# Patient Record
Sex: Male | Born: 1990 | Race: White | Hispanic: No | Marital: Single | Smoking: Former smoker
Health system: Southern US, Community
[De-identification: ages and names within clinical notes are randomized; demographics above are authoritative.]

## PROBLEM LIST (undated history)

## (undated) DIAGNOSIS — K5900 Constipation, unspecified: Secondary | ICD-10-CM

## (undated) DIAGNOSIS — F419 Anxiety disorder, unspecified: Secondary | ICD-10-CM

## (undated) DIAGNOSIS — F32A Depression, unspecified: Secondary | ICD-10-CM

## (undated) DIAGNOSIS — F329 Major depressive disorder, single episode, unspecified: Secondary | ICD-10-CM

## (undated) DIAGNOSIS — F99 Mental disorder, not otherwise specified: Secondary | ICD-10-CM

---
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
## 2008-04-04 ENCOUNTER — Emergency Department (HOSPITAL_COMMUNITY): Admission: EM | Admit: 2008-04-04 | Discharge: 2008-04-04 | Payer: Self-pay | Admitting: Emergency Medicine

## 2008-04-05 ENCOUNTER — Ambulatory Visit: Payer: Self-pay | Admitting: Psychiatry

## 2008-04-05 ENCOUNTER — Inpatient Hospital Stay (HOSPITAL_COMMUNITY): Admission: AD | Admit: 2008-04-05 | Discharge: 2008-04-07 | Payer: Self-pay | Admitting: Psychiatry

## 2008-04-24 ENCOUNTER — Emergency Department: Payer: Self-pay | Admitting: Emergency Medicine

## 2008-05-08 ENCOUNTER — Inpatient Hospital Stay (HOSPITAL_COMMUNITY): Admission: AD | Admit: 2008-05-08 | Discharge: 2008-05-10 | Payer: Self-pay | Admitting: *Deleted

## 2008-05-08 ENCOUNTER — Emergency Department (HOSPITAL_COMMUNITY): Admission: EM | Admit: 2008-05-08 | Discharge: 2008-05-08 | Payer: Self-pay | Admitting: Emergency Medicine

## 2008-05-08 ENCOUNTER — Ambulatory Visit: Payer: Self-pay | Admitting: *Deleted

## 2008-05-26 ENCOUNTER — Emergency Department (HOSPITAL_COMMUNITY): Admission: EM | Admit: 2008-05-26 | Discharge: 2008-05-27 | Payer: Self-pay | Admitting: Emergency Medicine

## 2008-05-27 ENCOUNTER — Inpatient Hospital Stay (HOSPITAL_COMMUNITY): Admission: EM | Admit: 2008-05-27 | Discharge: 2008-05-27 | Payer: Self-pay | Admitting: Psychiatry

## 2008-05-29 ENCOUNTER — Emergency Department (HOSPITAL_COMMUNITY): Admission: EM | Admit: 2008-05-29 | Discharge: 2008-05-31 | Payer: Self-pay | Admitting: Emergency Medicine

## 2008-05-31 ENCOUNTER — Inpatient Hospital Stay (HOSPITAL_COMMUNITY): Admission: AD | Admit: 2008-05-31 | Discharge: 2008-06-01 | Payer: Self-pay | Admitting: Psychiatry

## 2008-06-08 ENCOUNTER — Emergency Department (HOSPITAL_COMMUNITY): Admission: EM | Admit: 2008-06-08 | Discharge: 2008-06-09 | Payer: Self-pay | Admitting: Emergency Medicine

## 2008-06-12 ENCOUNTER — Emergency Department (HOSPITAL_COMMUNITY): Admission: EM | Admit: 2008-06-12 | Discharge: 2008-06-12 | Payer: Self-pay | Admitting: Emergency Medicine

## 2008-06-13 ENCOUNTER — Emergency Department (HOSPITAL_COMMUNITY): Admission: EM | Admit: 2008-06-13 | Discharge: 2008-06-13 | Payer: Self-pay | Admitting: Emergency Medicine

## 2008-06-13 ENCOUNTER — Inpatient Hospital Stay (HOSPITAL_COMMUNITY): Admission: EM | Admit: 2008-06-13 | Discharge: 2008-06-16 | Payer: Self-pay | Admitting: Psychiatry

## 2008-06-13 ENCOUNTER — Ambulatory Visit: Payer: Self-pay | Admitting: Psychiatry

## 2008-06-18 ENCOUNTER — Emergency Department (HOSPITAL_COMMUNITY): Admission: EM | Admit: 2008-06-18 | Discharge: 2008-06-19 | Payer: Self-pay | Admitting: Emergency Medicine

## 2008-06-19 ENCOUNTER — Inpatient Hospital Stay (HOSPITAL_COMMUNITY): Admission: EM | Admit: 2008-06-19 | Discharge: 2008-06-22 | Payer: Self-pay | Admitting: Psychiatry

## 2008-06-24 ENCOUNTER — Emergency Department (HOSPITAL_COMMUNITY): Admission: EM | Admit: 2008-06-24 | Discharge: 2008-06-25 | Payer: Self-pay | Admitting: Emergency Medicine

## 2008-07-28 ENCOUNTER — Emergency Department (HOSPITAL_COMMUNITY): Admission: EM | Admit: 2008-07-28 | Discharge: 2008-07-28 | Payer: Self-pay | Admitting: Diagnostic Radiology

## 2008-08-22 ENCOUNTER — Inpatient Hospital Stay (HOSPITAL_COMMUNITY): Admission: EM | Admit: 2008-08-22 | Discharge: 2008-08-23 | Payer: Self-pay | Admitting: *Deleted

## 2008-08-22 ENCOUNTER — Ambulatory Visit: Payer: Self-pay | Admitting: *Deleted

## 2008-08-22 ENCOUNTER — Emergency Department (HOSPITAL_COMMUNITY): Admission: EM | Admit: 2008-08-22 | Discharge: 2008-08-23 | Payer: Self-pay | Admitting: Emergency Medicine

## 2008-10-30 ENCOUNTER — Inpatient Hospital Stay: Payer: Self-pay | Admitting: Internal Medicine

## 2008-10-31 ENCOUNTER — Inpatient Hospital Stay: Payer: Self-pay | Admitting: Psychiatry

## 2009-01-20 ENCOUNTER — Inpatient Hospital Stay: Payer: Self-pay | Admitting: Psychiatry

## 2009-02-15 ENCOUNTER — Inpatient Hospital Stay: Payer: Self-pay | Admitting: Psychiatry

## 2009-02-18 ENCOUNTER — Other Ambulatory Visit: Payer: Self-pay | Admitting: Emergency Medicine

## 2009-02-19 ENCOUNTER — Ambulatory Visit: Payer: Self-pay | Admitting: *Deleted

## 2009-02-19 ENCOUNTER — Inpatient Hospital Stay (HOSPITAL_COMMUNITY): Admission: AD | Admit: 2009-02-19 | Discharge: 2009-02-21 | Payer: Self-pay | Admitting: *Deleted

## 2009-02-25 ENCOUNTER — Emergency Department: Payer: Self-pay | Admitting: Emergency Medicine

## 2009-05-17 ENCOUNTER — Other Ambulatory Visit: Payer: Self-pay

## 2009-05-17 ENCOUNTER — Inpatient Hospital Stay (HOSPITAL_COMMUNITY): Admission: RE | Admit: 2009-05-17 | Discharge: 2009-05-24 | Payer: Self-pay | Admitting: Psychiatry

## 2009-05-17 ENCOUNTER — Ambulatory Visit: Payer: Self-pay | Admitting: Psychiatry

## 2010-09-25 ENCOUNTER — Emergency Department: Payer: Self-pay | Admitting: Emergency Medicine

## 2010-11-28 ENCOUNTER — Inpatient Hospital Stay (HOSPITAL_COMMUNITY)
Admission: RE | Admit: 2010-11-28 | Discharge: 2010-12-01 | Payer: Self-pay | Source: Home / Self Care | Attending: Psychiatry | Admitting: Psychiatry

## 2010-11-28 ENCOUNTER — Emergency Department (HOSPITAL_COMMUNITY)
Admission: EM | Admit: 2010-11-28 | Discharge: 2010-11-28 | Disposition: A | Discharge status: Other Acute Inpatient Hospital | Payer: Self-pay | Source: Home / Self Care | Admitting: Emergency Medicine

## 2010-12-05 LAB — BASIC METABOLIC PANEL
BUN: 14 mg/dL (ref 6–23)
CO2: 25 mEq/L (ref 19–32)
Calcium: 9.2 mg/dL (ref 8.4–10.5)
Chloride: 107 mEq/L (ref 96–112)
Creatinine, Ser: 0.95 mg/dL (ref 0.4–1.5)
GFR calc Af Amer: >60 mL/min (ref >60)
GFR calc non Af Amer: >60 mL/min (ref >60)
Glucose, Bld: 95 mg/dL (ref 70–99)
Potassium: 3.7 mEq/L (ref 3.5–5.1)
Sodium: 138 mEq/L (ref 135–145)

## 2010-12-05 LAB — HEPATIC FUNCTION PANEL
ALT: 29 U/L (ref 0–53)
AST: 23 U/L (ref 0–37)
Albumin: 4.3 g/dL (ref 3.5–5.2)
Alkaline Phosphatase: 75 U/L (ref 39–117)
Bilirubin, Direct: <0.1 mg/dL (ref 0.0–0.3)
Total Bilirubin: 0.6 mg/dL (ref 0.3–1.2)
Total Protein: 7.3 g/dL (ref 6.0–8.3)

## 2010-12-05 LAB — RAPID URINE DRUG SCREEN, HOSP PERFORMED
Amphetamines: NOT DETECTED
Barbiturates: NOT DETECTED
Benzodiazepines: NOT DETECTED
Cocaine: NOT DETECTED
Opiates: NOT DETECTED
Tetrahydrocannabinol: NOT DETECTED

## 2010-12-05 LAB — DIFFERENTIAL
Basophils Absolute: 0.1 10*3/uL (ref 0.0–0.1)
Basophils Relative: 1 % (ref 0–1)
Eosinophils Absolute: 0.3 10*3/uL (ref 0.0–0.7)
Eosinophils Relative: 3 % (ref 0–5)
Lymphocytes Relative: 39 % (ref 12–46)
Lymphs Abs: 4.1 10*3/uL — ABNORMAL HIGH (ref 0.7–4.0)
Monocytes Absolute: 1.2 10*3/uL — ABNORMAL HIGH (ref 0.1–1.0)
Monocytes Relative: 11 % (ref 3–12)
Neutro Abs: 4.8 10*3/uL (ref 1.7–7.7)
Neutrophils Relative %: 46 % (ref 43–77)

## 2010-12-05 LAB — ETHANOL: Alcohol, Ethyl (B): <5 mg/dL (ref 0–10)

## 2010-12-05 LAB — CBC
HCT: 42.8 % (ref 39.0–52.0)
Hemoglobin: 15.3 g/dL (ref 13.0–17.0)
MCH: 31.1 pg (ref 26.0–34.0)
MCHC: 35.7 g/dL (ref 30.0–36.0)
MCV: 87 fL (ref 78.0–100.0)
Platelets: 311 10*3/uL (ref 150–400)
RBC: 4.92 MIL/uL (ref 4.22–5.81)
RDW: 12.1 % (ref 11.5–15.5)
WBC: 10.4 10*3/uL (ref 4.0–10.5)

## 2010-12-05 LAB — TRICYCLICS SCREEN, URINE: TCA Scrn: NOT DETECTED
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
## 2011-02-27 LAB — CBC
HCT: 43.2 % (ref 39.0–52.0)
HCT: 46.5 % (ref 39.0–52.0)
Hemoglobin: 14.8 g/dL (ref 13.0–17.0)
Hemoglobin: 15.9 g/dL (ref 13.0–17.0)
MCHC: 34.1 g/dL (ref 30.0–36.0)
MCHC: 34.2 g/dL (ref 30.0–36.0)
MCV: 90.4 fL (ref 78.0–100.0)
MCV: 90.9 fL (ref 78.0–100.0)
Platelets: 257 10*3/uL (ref 150–400)
Platelets: 269 10*3/uL (ref 150–400)
RBC: 4.78 MIL/uL (ref 4.22–5.81)
RBC: 5.12 MIL/uL (ref 4.22–5.81)
RDW: 11.8 % (ref 11.5–15.5)
RDW: 12.8 % (ref 11.5–15.5)
WBC: 10.1 10*3/uL (ref 4.0–10.5)
WBC: 8.6 10*3/uL (ref 4.0–10.5)

## 2011-02-27 LAB — RAPID URINE DRUG SCREEN, HOSP PERFORMED
Amphetamines: NOT DETECTED
Barbiturates: NOT DETECTED
Benzodiazepines: NOT DETECTED
Cocaine: NOT DETECTED
Opiates: NOT DETECTED
Tetrahydrocannabinol: NOT DETECTED

## 2011-02-27 LAB — DIFFERENTIAL
Basophils Absolute: 0 10*3/uL (ref 0.0–0.1)
Basophils Absolute: 0.1 10*3/uL (ref 0.0–0.1)
Basophils Relative: 0 % (ref 0–1)
Basophils Relative: 1 % (ref 0–1)
Eosinophils Absolute: 0.2 10*3/uL (ref 0.0–0.7)
Eosinophils Absolute: 0.3 10*3/uL (ref 0.0–0.7)
Eosinophils Relative: 2 % (ref 0–5)
Eosinophils Relative: 4 % (ref 0–5)
Lymphocytes Relative: 25 % (ref 12–46)
Lymphocytes Relative: 34 % (ref 12–46)
Lymphs Abs: 2.6 10*3/uL (ref 0.7–4.0)
Lymphs Abs: 2.9 10*3/uL (ref 0.7–4.0)
Monocytes Absolute: 1 10*3/uL (ref 0.1–1.0)
Monocytes Absolute: 1.2 10*3/uL — ABNORMAL HIGH (ref 0.1–1.0)
Monocytes Relative: 10 % (ref 3–12)
Monocytes Relative: 14 % — ABNORMAL HIGH (ref 3–12)
Neutro Abs: 4.1 10*3/uL (ref 1.7–7.7)
Neutro Abs: 6.4 10*3/uL (ref 1.7–7.7)
Neutrophils Relative %: 48 % (ref 43–77)
Neutrophils Relative %: 63 % (ref 43–77)

## 2011-02-27 LAB — BASIC METABOLIC PANEL
BUN: 9 mg/dL (ref 6–23)
CO2: 25 mEq/L (ref 19–32)
Calcium: 9.8 mg/dL (ref 8.4–10.5)
Chloride: 106 mEq/L (ref 96–112)
Creatinine, Ser: 0.98 mg/dL (ref 0.4–1.5)
GFR calc Af Amer: >60 mL/min (ref >60)
GFR calc non Af Amer: >60 mL/min (ref >60)
Glucose, Bld: 95 mg/dL (ref 70–99)
Potassium: 3.3 mEq/L — ABNORMAL LOW (ref 3.5–5.1)
Sodium: 141 mEq/L (ref 135–145)

## 2011-02-27 LAB — ETHANOL: Alcohol, Ethyl (B): <5 mg/dL (ref 0–10)

## 2011-03-01 LAB — CBC
HCT: 43.1 % (ref 39.0–52.0)
Hemoglobin: 15 g/dL (ref 13.0–17.0)
MCHC: 34.7 g/dL (ref 30.0–36.0)
MCV: 92 fL (ref 78.0–100.0)
Platelets: 297 10*3/uL (ref 150–400)
RBC: 4.69 MIL/uL (ref 4.22–5.81)
RDW: 13 % (ref 11.5–15.5)
WBC: 10.9 10*3/uL — ABNORMAL HIGH (ref 4.0–10.5)

## 2011-03-01 LAB — BASIC METABOLIC PANEL
BUN: 14 mg/dL (ref 6–23)
CO2: 28 mEq/L (ref 19–32)
Calcium: 9.5 mg/dL (ref 8.4–10.5)
Chloride: 108 mEq/L (ref 96–112)
Creatinine, Ser: 0.87 mg/dL (ref 0.4–1.5)
GFR calc Af Amer: >60 mL/min (ref >60)
GFR calc non Af Amer: >60 mL/min (ref >60)
Glucose, Bld: 107 mg/dL — ABNORMAL HIGH (ref 70–99)
Potassium: 3.9 mEq/L (ref 3.5–5.1)
Sodium: 142 mEq/L (ref 135–145)

## 2011-03-01 LAB — RAPID URINE DRUG SCREEN, HOSP PERFORMED
Amphetamines: NOT DETECTED
Barbiturates: NOT DETECTED
Benzodiazepines: NOT DETECTED
Cocaine: NOT DETECTED
Opiates: NOT DETECTED
Tetrahydrocannabinol: NOT DETECTED

## 2011-03-01 LAB — DIFFERENTIAL
Basophils Absolute: 0.1 10*3/uL (ref 0.0–0.1)
Basophils Relative: 1 % (ref 0–1)
Eosinophils Absolute: 0.1 10*3/uL (ref 0.0–0.7)
Eosinophils Relative: 1 % (ref 0–5)
Lymphocytes Relative: 22 % (ref 12–46)
Lymphs Abs: 2.4 10*3/uL (ref 0.7–4.0)
Monocytes Absolute: 1.6 10*3/uL — ABNORMAL HIGH (ref 0.1–1.0)
Monocytes Relative: 15 % — ABNORMAL HIGH (ref 3–12)
Neutro Abs: 6.7 10*3/uL (ref 1.7–7.7)
Neutrophils Relative %: 61 % (ref 43–77)

## 2011-03-01 LAB — ETHANOL: Alcohol, Ethyl (B): <5 mg/dL (ref 0–10)
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
## 2011-04-04 NOTE — Discharge Summary (Signed)
NAMETIEN, SPOONER NO.:  [id number]   MEDICAL RECORD NO.:  [id number]          PATIENT TYPE:  IPS   LOCATION:  0303                          FACILITY:  [facility]   PHYSICIAN:  Jasmine Pang, M.D. DATE OF BIRTH:  Aug 27, 1991   DATE OF ADMISSION:  02/19/2009  DATE OF DISCHARGE:  02/21/2009                               DISCHARGE SUMMARY   IDENTIFICATION:  This is a 20 year old single white male who was  admitted on a voluntary basis on February 19, 2009.   HISTORY OF PRESENT ILLNESS:  The patient presented to the emergency  department at [facility].  He stated he wanted to commit himself because  he was feeling suicidal.  For further admission information, see  psychiatric admission assessment.   PHYSICAL FINDINGS:  There were no acute physical or medical problems  noted.  His physical exam was done in the ED prior to transfer here.   DIAGNOSTIC STUDIES:  UDS was negative.  He has no alcohol.  He has no  worrisome findings on the CBC and chemistries.   HOSPITAL COURSE:  Upon admission, the patient was started back on  trazodone 300 mg p.o. q.h.s.  in individual sessions, the patient was  upset about being in the hospital.  He stated he has been increased  depression recently, but he stated I just want to leave.  He denied  suicidal ideation.  He denied auditory or visual hallucinations.  He  states he had had conflict with his girlfriend on 02/21/2009, but he was  also somewhat aggressive and harassing towards other patients and staff.  On February 21, 2009, his sleep was good, appetite was good.  Mood was  euthymic.  Affect consistent with mood.  There was no suicidal or  homicidal ideation.  No thoughts of self injurious behavior.  No  auditory or visual hallucinations.  No paranoia or delusions.  Thoughts  were logical and goal directed.  Thought content no predominant theme.  Cognitive was grossly intact.  Insight poor and judgment fair.  Impulse  control fair.  It  was felt the patient was safe for discharge today.   DISCHARGE DIAGNOSES:  Axis I:  Mood disorder, not otherwise specified,  marijuana abuse.  Axis II:  Borderline personality disorder.  Axis III:  Healthy.  Axis IV:  Moderate (recent termination from the employment also recent  conflict with his girlfriend).  Axis V:  Global assessment of functioning was 50 upon discharge.  Global  assessment of functioning was 30 upon admission.  Global assessment of  functioning highest past year was 60.   DISCHARGE PLAN:  There were no specific activity level of dietary  restrictions.  Post hospital care plans, the patient will call [facility].  The patient will be scheduled with [facility] for followup  treatment.   DISCHARGE MEDICATIONS:  None.      Jasmine Pang, M.D.  Electronically Signed     BHS/MEDQ  D:  02/22/2009  T:  02/23/2009  Job:  [id number]

## 2011-04-04 NOTE — H&P (Signed)
NAMEJOHNATHIN, VANDERSCHAAF NO.:  [id number]   MEDICAL RECORD NO.:  [id number]          PATIENT TYPE:  IPS   LOCATION:  0505                          FACILITY:  [facility]   PHYSICIAN:  Geoffery Lyons, M.D.      DATE OF BIRTH:  1991-06-27   DATE OF ADMISSION:  05/17/2009  DATE OF DISCHARGE:                       PSYCHIATRIC ADMISSION ASSESSMENT   A 20 year old male voluntarily admitted on May 17, 2009.   HISTORY OF PRESENT ILLNESS:  The patient presents with a history of  depression, suicidal thoughts and just did not feel safe. Recently  lost his father about 2 weeks ago.  The patient states that one of his  other stressors is that he is getting evicted and has poor social  support.   PAST PSYCHIATRIC HISTORY:  The patient has had several admissions here,  last visit was February 27, 2009.  Reports a history of bipolar disorder.   SOCIAL HISTORY:  This is a 20 year old male.  He has again poor social  support and currently unemployed.   FAMILY HISTORY:  Unknown.   ALCOHOL/DRUG HISTORY:  The patient reports using heroin approximately 3  weeks ago.   PRIMARY CARE Maleke Feria:  Unknown.   MEDICAL PROBLEMS:  No acute or chronic health issues.   MEDICATIONS:  None.   DRUG ALLERGIES:  NO KNOWN ALLERGIES.   PHYSICAL EXAMINATION:  GENERAL:  This is a young male.  He was assessed  at [facility] Emergency Department.  EXTREMITIES:  He has numerous well-healed scars to his left forearm that  are self-inflicted.   LABORATORY DATA:  CBC within normal limits.  Urine drug screen is  negative.  Alcohol level less than 5.  Potassium 3.3.   MENTAL STATUS EXAM:  The patient is cooperative, although somewhat  sleepy.  Speech clear.  The patient's mood neutral.  Thought processes  are coherent and goal directed.  Memory is intact.  His judgment and  insight are fair.   AXIS I:  Mood disorder not otherwise specified.  AXIS II:  Deferred.  AXIS III:  No known medical conditions.  AXIS  IV:  Problems with primary support group, housing, psychosocial  problems.  AXIS V:  Current is 30.   PLAN:  Our plan is to contract for safety.  We will stabilize mood and  thinking.  The patient to increase coping skills.  Case manager will  assess his housing situation and we will have a family session.  Tentative length of stay is 3-5 days.      Landry Corporal, N.P.      Geoffery Lyons, M.D.  Electronically Signed    JO/MEDQ  D:  05/21/2009  T:  05/21/2009  Job:  [id number]
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
## 2011-04-04 NOTE — Discharge Summary (Signed)
NAMELATOYA, DISKIN NO.:  [id number]   MEDICAL RECORD NO.:  [id number]          PATIENT TYPE:  IPS   LOCATION:  0602                          FACILITY:  [facility]   PHYSICIAN:  Jasmine Pang, M.D. DATE OF BIRTH:  1991/08/24   DATE OF ADMISSION:  06/13/2008  DATE OF DISCHARGE:  06/16/2008                               DISCHARGE SUMMARY   IDENTIFYING INFORMATION:  This is an 20 year old single white male who  was admitted on a voluntary basis on June 13, 2008.   HISTORY OF PRESENT ILLNESS:  The patient presented to the [facility]  emergency department reporting he had been using heroin for about 6  months and wants to stop.  He states he had been trying to stop using by  himself and he has gotten bad enough that he felt like killing himself.  He was noted to have numerous self-inflicted superficial lacerations in  various stages of healing to bilateral forearms.  None were recent.  His  UDS was remarkable only for marijuana.  He has recently been in jail.  He states he entered jail on June 06, 2008, and he was in until this  past Thursday.   PAST PSYCHIATRIC HISTORY:  The patient has been with [place] several times  most recently he was discharged on June 01, 2008, prior to that Apr 05, 2008 - Apr 07, 2008.   FAMILY HISTORY:  The patient states his father abused alcohol.  His  uncle uses cocaine.   SUBSTANCE ABUSE HISTORY:  The patient has been using marijuana since age  66 and he states cocaine and heroin since the age of 43.  He also smokes  a half-pack cigarettes per day.   MEDICAL PROBLEMS:  None.   DRUG ALLERGIES:  No known drug allergies.   MEDICATIONS:  None.   PHYSICAL FINDINGS:  There were no acute physical or medical problems  noted.  The patient's full physical exam was done in the ED prior to  admission here.  There were numerous self-inflicted superficial  lacerations some did require stapling.  However, the staples removed  several days ago.   There were no new lacerations.   ADMISSION LABORATORIES:  As already stated, his lab work was remarkable  only for [facility].  He did not have any other remarkable findings.  His  potassium was slightly low at 3.2.   HOSPITAL COURSE:  Upon admission, the patient was placed on Ambien 10 mg  p.o. q.h.s. p.r.n., insomnia and the clonidine detox protocol and Celexa  10 mg in the a.m. and at h.s.  At first, he stated he wanted a 28-day  treatment program.  He was cooperative in individual sessions, but  somewhat resistant to attending unit therapeutic groups and activities.  He would stay in bed and sleep.  He was tolerating his detox program  well.  As hospitalization progressed, sleep was good, appetite was good,  mood was improved.  On June 14, 2008, he was still staying in bed much  of the day and did not want to talk to me other than to say  he was not  suicidal.  On June 15, 2008, the patient requests discharge.  He stated  he did not want to complete the clonidine detox protocol.  He did not  feel he needed it.  He did not want to be on any medications when he  left, but he did agree to try the Celexa since it was such a cost-  effective medicine.  He stated he did not want to participate in unit  therapeutic groups and activities and would refused to do so if he had  to stay.  It was felt the patient was safe for discharge.Marland Kitchen   DISCHARGE DIAGNOSES:  Axis I:  Poly substance dependence.  Depressive  disorder, not otherwise specified.  Axis II:  Features of borderline personality disorder.  Axis III:  Numerous self-inflicted lacerations in various stages of  healing.  Axis IV:  Severe (recently released from jail.  He denies any active  legal charges at this time.  Problems with primary support group, other  psychosocial problems).  Axis V:  Global assessment of functioning was 48 at discharge.  GAF was  25 upon admission.  GAF highest past year was 60-65.   DISCHARGE PLANS:  There was no  specific activity level or dietary  restriction.   POSTHOSPITAL CARE PLANS:  The patient did not want any followup  treatment.  He refused followup.   DISCHARGE MEDICATIONS:  The patient did not want to continue the  clonidine detox protocol.  He did not feel he needed it.  He was  discharged with prescription for Celexa 20 mg daily and was told he  could buy this for 4$ at a [employer].  I encouraged him to stay  on the medication.  He was unclear whether he would do this or not but  did take the prescription.      Jasmine Pang, M.D.  Electronically Signed     BHS/MEDQ  D:  06/16/2008  T:  06/16/2008  Job:  [id number]
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
## 2011-04-04 NOTE — H&P (Signed)
[name], [name] NO.:  [id number]   MEDICAL RECORD NO.:  [id number]          PATIENT TYPE:  IPS   LOCATION:  0303                          FACILITY:  [facility]   PHYSICIAN:  Jasmine Pang, M.D. DATE OF BIRTH:  1991/04/17   DATE OF ADMISSION:  02/19/2009  DATE OF DISCHARGE:                       PSYCHIATRIC ADMISSION ASSESSMENT   This is a voluntary admission to the services of Dr. Milford Cage.   This is a 20 year old single white male who presented to the emergency  department at [facility].  He stated I want to comit myself because I  feel suicidal.  He reported that he had a plan to kill himself.  He has  been thinking about it for weeks but he has no attempted.  He had no  drugs or alcohol on board.  He is well known to [place].  He had 9 admissions  last year although the last time he was here was August 22, 2008.  He  has cut on himself in the past.  He does have old well-healed  superficial scars on his right arm.  He reported that he had overdosed  in December of 2009 but apparently it did not require hospitalization.  The trigger this time is the actual breakup of his girlfriend.  He  states that she is not allowing him to see their 65-month-old daughter  and relationship issues were the basis of all of his admissions last  year.   SOCIAL HISTORY:  He did not graduate high school.  He is not working.  He states he begs for money and he is living with his mother.   FAMILY HISTORY:  He has an uncle who uses cocaine.  His father uses  alcohol.   ALCOHOL AND DRUG HISTORY:  He denies.  He states that he does smoke 1-2  cigarettes a day and his UDS is negative.   PRIMARY CARE PHYSICIAN:  He does not have one.   MEDICAL PROBLEMS:  None are known.   MEDICATIONS:  He is noncompliant with prescribed medications.  He  reports that he was prescribed Celexa, Depakote, trazodone and Seroquel  at the [facility].   DRUG ALLERGIES:  No known  drug allergies.   POSITIVE PHYSICAL FINDINGS:  As already stated his UDS was negative.  He  had no alcohol.  He had no worrisome findings on his CBC or his  chemistries.  His vital signs on admission show that he is 68-1/2 inches  tall, he weighs 157.  His temperature is 97.1, blood pressure is 118/65,  pulse 85, respirations 18.   MENTAL STATUS EXAM:  He was seen in his room.  He would not get out of  bed.  He appeared to be depressed.  His motor seemed to be  neurovegetative.  He was otherwise appropriately groomed, dressed and  nourished.  Judgment and insight are fair.  Concentration and memory are  intact.  Intelligence is average.  He is not actively suicidal.  He can  contract for safety.  He is not homicidal and he is not having auditory  or visual  hallucinations.   AXIS I:  Mood disorder not otherwise specified.  AXIS II:  Borderline personality disorder.  AXIS III:  None known.  He does have well-healed superficial lacerations  on his right extremity.  AXIS IV:  Relationship issues.  AXIS V:  35.   PLAN:  To start him on Abilify 10 mg p.o. daily, hopefully this will  increase his mood and enable [place] to treat him with just one medication.  Estimated length of stay is 3-5 day.      [name], P.A.-C.      Jasmine Pang, M.D.  Electronically Signed    MD/MEDQ  D:  02/20/2009  T:  02/20/2009  Job:  [id number]

## 2011-04-04 NOTE — Discharge Summary (Signed)
NAMEJAQUE, DACY NO.:  [id number]   MEDICAL RECORD NO.:  [id number]          PATIENT TYPE:  IPS   LOCATION:  0502                          FACILITY:  [facility]   PHYSICIAN:  Antonietta Breach, M.D.  DATE OF BIRTH:  09/17/1991   DATE OF ADMISSION:  04/05/2008  DATE OF DISCHARGE:  04/07/2008                               DISCHARGE SUMMARY   IDENTIFYING DATA/REASON FOR ADMISSION:  Mr. Diem is a 20 year old male  who became intoxicated with ecstasy and was cutting his forearms,  creating superficial lacerations.  He was threatening to kill himself by  driving a car dangerously.  He was also in a pattern of abusing cocaine,  ecstasy, heroin, marijuana and Valium, all over the past 2 weeks before  admission.   PERTINENT MEDICAL:  His urine drug screen was positive for THC.  Aspirin  was negative.  Alcohol was negative.  The patient did not demonstrate  any medical sequelae.   HOSPITAL COURSE:  Mr. Gacek was admitted to the adult inpatient  psychiatric unit and underwent milieu and group psychotherapy.  His mood  quickly returned to normal with constructive interests in future goals.  He was not having any thoughts of harming himself or others.  He had no  delusions or hallucinations.  He was socially appropriate.   CONDITION ON DISCHARGE:  Mr. Perz is alert and oriented in all  spheres.  His mood is within normal limits.  His affect is bright and  appropriate.  Thought process logical, coherent, goal directed.  No  looseness of associations.  Thought content:  No thoughts of harming  himself.  No thoughts of harming others.  No delusions.  No  hallucinations.  Insight is good.  Judgment is intact.   AFTERCARE:  Mr. Heidelberger agrees to call emergency services immediately for  any thoughts of harming himself, thoughts of harming others or distress.   Please see the discharge instruction sheet.  Mr. Knierim will follow up  with [facility] [place].  He will  go to 12-step meetings,  90 in 90 days.   DISCHARGE MEDICATIONS:  None.   DISCHARGE DIAGNOSIS:  AXIS I:  Adjustment disorder with mixed  disturbance of emotions and conduct, resolved.  Polysubstance  dependence.  AXIS II:  Deferred.  AXIS III:  Superficial lacerations, well healing, with no sequelae.  AXIS IV:  Primary support group.  AXIS V:  55.     Antonietta Breach, M.D.  Electronically Signed    JW/MEDQ  D:  04/07/2008  T:  04/07/2008  Job:  [id number]

## 2011-04-04 NOTE — Discharge Summary (Signed)
NAMERAFEL, GARDE NO.:  [id number]   MEDICAL RECORD NO.:  [id number]          PATIENT TYPE:  IPS   LOCATION:  0303                          FACILITY:  BH   PHYSICIAN:  Geoffery Lyons, M.D.      DATE OF BIRTH:  12/10/1990   DATE OF ADMISSION:  06/19/2008  DATE OF DISCHARGE:  06/22/2008                               DISCHARGE SUMMARY   CHIEF COMPLAINT:  This was one of multiple admissions to [facility] for this 20 year old single male voluntarily admitted.  He was discharged earlier this week from [facility], left to live  with his girlfriend and endorsed that they were using opiate drugs and  have used some IV heroin.  Last use was July 25,2009.  They were living  together.  Gradually, over the last few days, they started arguing and  fighting, broke up, and he left.  He is now homeless, having thoughts of  wanting to kill himself by cutting his wrists, has made numerous cuts,  most of them fairly superficial and on his forearms.  He reports  drinking some alcohol, and UDS was positive for marijuana.   PAST PSYCHIATRIC HISTORY:  Multiple admissions to [facility], has also been in [facility], no outpatient followup.   ALCOHOL AND DRUG HISTORY:  As already stated.  History of marijuana use  at age 24, heroin and cocaine since age 72, recent IV heroin.   MEDICAL HISTORY:  Herpes simplex, type 2.   MEDICATIONS:  None.   PHYSICAL EXAMINATION:  Compatible with the lacerations already stated.   LABORATORY WORKUP:  UDS positive for marijuana.  CBC:  White blood cells  9.8, hemoglobin 15.2.  Sodium 140, potassium 3.4, BUN 7, creatinine  1.24.   MENTAL STATUS EXAMINATION:  Reveals a fully alert, cooperative male.  Mood is depressed.  Affect is depressed, pretty disheveled.  Speech was  not as spontaneous, barely audible, offers one-word to two-word answers,  admits he is feeling hopeless, depressed with suicidal  ruminations, no  homicidal ideas, no delusions.  No hallucinations.  Cognition is well  preserved.   DIAGNOSES:  AXIS I:  Marijuana abuse, opiate abuse, cocaine abuse,  depressive disorder, not otherwise specified.  AXIS II:  No diagnosis.  AXIS III:  Laceration, right wrist.  Multiple superficial lacerations.  AXIS IV:  Moderate.  AXIS V:  On admission 35.  Highest global assessment of functioning in  the last year 55-60.   COURSE IN THE HOSPITAL:  He was admitted.  He was started on individual  and group psychotherapy.  We started detox with clonidine.  As already  stated, one of multiple admissions for this 20 year old male.  History  of polysubstance abuse, lately marijuana.  After he left last time, he  claimed he went with a male he met during his previous admission.  They broke up, got very upset, started thinking about killing himself.  Requested help.  He said he used heroin IV.  He continued to be mostly  psychomotor very retarded, in bed, feeling hopeless, helpless, wanting  to go back on the Celexa.  Would resume Celexa 20 mg per day.  He spoke  with his father, and he claimed that the father might be willing to help  him out.  He claimed he wanted to get his life back together, admitted  to mood fluctuation and admits to impulsivity.  He was trying to see if  he could make it to go to rehab, looking to go into [facility].  If he  was to do so, he was going to stay in that area because the father lives  there.  There was some improved communication with the father, and he  was hopeful that this was going to work for him.  There was  communication with the father to see if he was going to be able to pay  for Shakim to go into the residential treatment program and see if he  was willing to help him out, but then by June 22, 2008, Kebin had  again changed his mind.  He was not going to go into rehab.  He claimed  no suicidal or homicidal ideas.  He claimed that he was  ready to go and  that staying in the unit was not going to help him but make things worse  for him.  So, we went ahead and discharged to outpatient followup.   DISCHARGE DIAGNOSES:  AXIS I:  Mood disorder, not otherwise specified.  Impulse control, not otherwise specified, marijuana, opiates and alcohol  abuse.  AXIS II:  No diagnosis.  AXIS III:  Status post self-induced lacerations.  AXIS IV:  Moderate.  AXIS V:  On discharge 50.   Discharged on Celexa 20 mg per day.   Followup by [facility], Dr. Lang Snow.      Geoffery Lyons, M.D.  Electronically Signed     IL/MEDQ  D:  07/21/2008  T:  07/21/2008  Job:  [id number]

## 2011-04-04 NOTE — H&P (Signed)
NAMEABIEL, ANTRIM NO.:  [id number]   MEDICAL RECORD NO.:  [id number]          PATIENT TYPE:  IPS   LOCATION:  0602                          FACILITY:  BH   PHYSICIAN:  Geoffery Lyons, M.D.      DATE OF BIRTH:  Dec 26, 1990   DATE OF ADMISSION:  06/13/2008  DATE OF DISCHARGE:                       PSYCHIATRIC ADMISSION ASSESSMENT   This is a voluntary admission.   IDENTIFYING INFORMATION:  This is an 20 year old single white male.  He  presented to the [facility] Emergency Department.  He reported that he  had been using heroin for about 6 months, and he wants to stop.  He says  he has been trying to stop using by himself, and it has gotten bad  enough that he has felt like killing himself.  He was noted to have  numerous self-inflicted superficial lacerations in various stages of  healing to bilateral forearms.  None were recent.  His UDS was  remarkable only for marijuana.  He has recently been in jail.  He states  he entered jail on June 06, 2008, and he was in until this past  Thursday.   PAST PSYCHIATRIC HISTORY:  Lois has been with [place] several times.  Most  recently, he was discharged on June 01, 2008; prior to that, Apr 05, 2008 - Apr 07, 2008.   SOCIAL HISTORY:  He did not graduate high school yet.  He has never been  employed.  He is homeless, and apparently he has a girl pregnant.   FAMILY HISTORY:  He states his father abused alcohol.  His uncle uses  cocaine.   ALCOHOL AND DRUG HISTORY:  He has been using marijuana since age 62, and  he states cocaine and heroin since age 89.  He also smokes 1/2 pack of  cigarettes per day.   He does not have a primary care physician, nor does he engage in mental  health.   MEDICAL PROBLEMS:  None are known.   DRUG ALLERGIES:  No known drug allergies.   POSITIVE PHYSICAL FINDINGS:  GENERAL:  As already stated, he has  numerous self-inflicted superficial lacerations.  Some did require  stapling.   However, those staples were removed several days ago.  Other  than that, he appears his stated age of 80.  He states that he is  anxious today and that his body hurts from withdrawing.  VITAL SIGNS ON ADMISSION:  He is 67-1/2 inches tall, he weighs 142,  temperature is 97.8, blood pressure is 120/81-141/73, pulse is 73-93,  and respirations 24.   As already stated, his lab work was remarkable only for [facility].  Surprisingly, he really did not have any other remarkable findings.  His  potassium was slightly low at 3.2.   MENTAL STATUS EXAM:  He was examined in bed in his room.  He is casually  groomed and dressed.  He does appear to have living on the streets,  which was what he reported.  His attitude and behavior:  He has pretty  good eye contact.  He is calm.  He is cooperative.  Motor:  He had no  apparent involuntary movements.  His mood was dysphoric.  His affect was  mood congruent.  His range of affect has a full range.  His speech is  normal rate, rhythm, and tone.  He is not actively suicidal or  homicidal.  His thought form was clear, rational, goal oriented.  He  wants to go to a 28-day program.  His thought content is somewhat  impoverished.  He had no delusions or paranoia.  Appearance:  He is  alert and oriented x3.  His abstraction ability is poor, and his insight  and judgment are poor.  Intelligence is average.   DIAGNOSES:   AXIS I:  Polysubstance dependence.   AXIS II:  Borderline personality disorder.   AXIS III:  Numerous self-inflicted superficial lacerations in various  stages of healing.   AXIS IV:  Severe.  Recently released from prison.  He denies any active  legal charges at this time.   AXIS V:  25.   PLAN:  To admit for safety and stabilization.  He wants something for  anxiety.  Toward that end, we will start Celexa 10 mg p.o. at a.m. and  at bedtime.  He is also asking for a 28-day drug rehab program.  He is  unemployed, and he has no income.    ESTIMATED LENGTH OF STAY:  3-5 days.      [name], P.A.-C.      Geoffery Lyons, M.D.  Electronically Signed    MD/MEDQ  D:  06/14/2008  T:  06/14/2008  Job:  [id number]

## 2011-04-04 NOTE — Discharge Summary (Signed)
NAMEANSEN, [name]   MEDICAL RECORD NO.:  [name]          PATIENT TYPE:  IPS   LOCATION:  0505                          FACILITY:  [facility]   PHYSICIAN:  Geoffery Lyons, M.D.      DATE OF BIRTH:  [birth year]   DATE OF ADMISSION:  05/17/2009  DATE OF DISCHARGE:  05/24/2009                               DISCHARGE SUMMARY   CHIEF COMPLAINT:  This was one of several admissions to [facility] for this 20 year old male voluntarily admitted.  Presented with a history of depression, thoughts of suicide.  Just did  not feel safe.  Recently lost his father about 2 weeks prior to this  admission.  One of his other stressors is that he is getting evicted and  has poor social support.   PAST PSYCHIATRIC HISTORY:  History of multiple admissions, last time  February 20, 2009, history of a mood disorder, possibly bipolar.  History of  substance abuse.  Claims no active use.   ALCOHOL AND DRUG HISTORY:  As already stated.  History of substance  abuse.  Last use of heroin 3 weeks prior to this admission.   MEDICAL HISTORY:  Noncontributory.   MEDICATIONS:  None.   EXAM:  Failed to show any acute findings except for numerous well-healed  scars to his left forearm self-inflicted.   LABORATORY WORK:  CBC within normal limits.  UDS negative.  Potassium  3.3.   MENTAL STATUS EXAMINATION:  Reveals alert cooperative male, somewhat  sleepy.  Speech is clear.  Mood anxious.  Affect anxious.  Thought  processes logical, coherent and relevant.  No active homicidal or  suicidal ideas, no delusions.  No hallucinations.  Cognition well  preserved.   ADMITTING DIAGNOSES:  AXIS I:  Mood disorder not otherwise specified.  AXIS II:  No diagnosis.  AXIS III:  No diagnosis.  AXIS IV:  Moderate.  AXIS V:  On admission 35.  Highest GAF in the last year 60.   COURSE IN THE HOSPITAL:  Was admitted, started individual and group  psychotherapy.  As already stated a  20 year old male known to our  service who admits to increased signs and symptoms of depression.  He  has not been able to get himself back together.  Father died 2 weeks  prior to this admission.  He saw him in May at the sister's wedding.  Says that he does not have a stable place to go.  He is not staying here  or there.  Claims he is not doing drugs.  Did endorse tooth ache.  Nonchalant attitude when he talks about his father's death.  No emotion  conveyed, but endorsed he is overwhelmed with everything that is going  on, wanting some help.  Endorsed depression as his hospitalization  continued.  He said Celexa helped.  We went ahead and started Celexa 20  and he was placed back on Seroquel that he had used before.  Still not  sure where he wanted to do, where he wanted to go.  Wanting to go to  [facility], where  he could be employed due to the season.  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  We pursued  further stabilization.  July 2 he continued to go back and forth in  terms of where to go from here.  Upset with the situation he was in.  When asked for specifics, was very vague.  He wanted to go to [facility].  We increased the Seroquel.  He continued to get stable.  Admitted that he sometimes avoids dealing with his own self, a lot of  self-deprecatory remarks.  We worked on coping.  Wanted to leave and go  somewhere else.  We worked with the Celexa and the Seroquel.  By July 5  he was in full contact with reality.  Objectively better.  He had indeed  abstained from substances.  He still was wanting to go to [facility]  where he felt he could be employed and be there for a while.   DISCHARGE DIAGNOSES:  AXIS I:  Mood disorder not otherwise specified,  anxiety not otherwise specified.  Impulse control not otherwise  specified, rule out attention deficit hyperactivity disorder, substance  abuse in remission.  AXIS II:  No diagnosis.  AXIS III:   No diagnosis.  AXIS IV:  Moderate.  AXIS V:  On discharge 50.   DISCHARGE MEDICATIONS:  1. Celexa 20 mg per day.  2. Seroquel 200 mg at bedtime.   FOLLOW UP:  He declined followup.  He will procure followup when he gets  to the place he is planning to get to.      Geoffery Lyons, M.D.  Electronically Signed     IL/MEDQ  D:  06/23/2009  T:  06/23/2009  Job:  [id number]
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
## 2011-08-16 LAB — ETHANOL: Alcohol, Ethyl (B): <5

## 2011-08-16 LAB — URINALYSIS, ROUTINE W REFLEX MICROSCOPIC
Ketones, ur: NEGATIVE
Nitrite: NEGATIVE
Specific Gravity, Urine: 1.015
pH: 8

## 2011-08-16 LAB — ACETAMINOPHEN LEVEL: Acetaminophen (Tylenol), Serum: <10 — ABNORMAL LOW

## 2011-08-16 LAB — CBC
HCT: 43.3
MCV: 88.6
Platelets: 307
Platelets: 321
RBC: 4.89
WBC: 6.5
WBC: 7.8

## 2011-08-16 LAB — POCT I-STAT, CHEM 8
BUN: 5 — ABNORMAL LOW
Calcium, Ion: 1.19
Chloride: 101
Creatinine, Ser: 0.9
Glucose, Bld: 100 — ABNORMAL HIGH
HCT: 49
Hemoglobin: 16.7
Potassium: 3.2 — ABNORMAL LOW
Sodium: 139
TCO2: 27

## 2011-08-16 LAB — DIFFERENTIAL
Eosinophils Absolute: 0
Lymphocytes Relative: 30
Lymphs Abs: 2
Neutro Abs: 3.5
Neutrophils Relative %: 54

## 2011-08-16 LAB — RAPID URINE DRUG SCREEN, HOSP PERFORMED: Cocaine: NOT DETECTED

## 2011-08-16 LAB — SALICYLATE LEVEL: Salicylate Lvl: <4

## 2011-08-17 LAB — CBC
HCT: 44.7
HCT: 48.8
Hemoglobin: 15.3
MCHC: 34.2
MCHC: 34.1
MCV: 90.2
MCV: 89.6
Platelets: 336
RBC: 4.95
RDW: 13

## 2011-08-17 LAB — URINALYSIS, ROUTINE W REFLEX MICROSCOPIC
Bilirubin Urine: NEGATIVE
Glucose, UA: NEGATIVE
Hgb urine dipstick: NEGATIVE
Hgb urine dipstick: NEGATIVE
Protein, ur: NEGATIVE
Protein, ur: NEGATIVE
Urobilinogen, UA: 1
pH: 6.5

## 2011-08-17 LAB — POCT I-STAT, CHEM 8
BUN: 20
Calcium, Ion: 1.14
Chloride: 105
Chloride: 100
Creatinine, Ser: 1.2
Creatinine, Ser: 1.3
HCT: 45
HCT: 51
Hemoglobin: 15.3
Hemoglobin: 17.3 — ABNORMAL HIGH
Potassium: 3.7
Potassium: 4.1
Sodium: 138
Sodium: 140
TCO2: 26
TCO2: 29

## 2011-08-17 LAB — RAPID URINE DRUG SCREEN, HOSP PERFORMED
Amphetamines: NOT DETECTED
Amphetamines: NOT DETECTED
Barbiturates: NOT DETECTED
Benzodiazepines: NOT DETECTED
Benzodiazepines: NOT DETECTED
Cocaine: NOT DETECTED
Cocaine: NOT DETECTED
Opiates: NOT DETECTED
Opiates: NOT DETECTED
Tetrahydrocannabinol: POSITIVE — AB

## 2011-08-17 LAB — ETHANOL: Alcohol, Ethyl (B): <5

## 2011-08-17 LAB — DIFFERENTIAL
Basophils Relative: 1
Basophils Relative: 1
Eosinophils Absolute: 0.2
Eosinophils Absolute: 0.1
Eosinophils Relative: 2
Eosinophils Relative: 1
Monocytes Absolute: 1
Monocytes Relative: 10
Neutrophils Relative %: 63

## 2011-08-18 LAB — DIFFERENTIAL
Basophils Absolute: 0.1
Basophils Absolute: 0.1
Basophils Relative: 1
Basophils Relative: 1
Eosinophils Absolute: 0.1
Eosinophils Absolute: 0.1
Eosinophils Relative: 1
Lymphocytes Relative: 34
Lymphocytes Relative: 35
Lymphs Abs: 3.3
Monocytes Absolute: 1.1 — ABNORMAL HIGH
Monocytes Relative: 11
Neutro Abs: 3.3
Neutro Abs: 5.3
Neutrophils Relative %: 54
Neutrophils Relative %: 61

## 2011-08-18 LAB — POCT I-STAT, CHEM 8
HCT: 45
Hemoglobin: 15.3
Potassium: 3.6
Sodium: 139
TCO2: 27

## 2011-08-18 LAB — CBC
HCT: 44.4
HCT: 44.5
Hemoglobin: 15.2
MCHC: 34.1
MCHC: 34.2
MCHC: 33.6
MCV: 88.7
MCV: 91.3
MCV: 90.4
Platelets: 273
Platelets: 313
Platelets: 314
RBC: 4.87
RDW: 13.3
RDW: 12.9
WBC: 7.3
WBC: 9.8

## 2011-08-18 LAB — URINALYSIS, ROUTINE W REFLEX MICROSCOPIC
Bilirubin Urine: NEGATIVE
Bilirubin Urine: NEGATIVE
Glucose, UA: NEGATIVE
Glucose, UA: NEGATIVE
Ketones, ur: NEGATIVE
Ketones, ur: NEGATIVE
Leukocytes, UA: NEGATIVE
Protein, ur: NEGATIVE
Specific Gravity, Urine: 1.023
pH: 6

## 2011-08-18 LAB — COMPREHENSIVE METABOLIC PANEL
BUN: 7
CO2: 28
Calcium: 9.3
Chloride: 105
Creatinine, Ser: 1.03
GFR calc non Af Amer: >60
Total Bilirubin: 0.9

## 2011-08-18 LAB — RAPID URINE DRUG SCREEN, HOSP PERFORMED
Amphetamines: NOT DETECTED
Barbiturates: NOT DETECTED
Barbiturates: NOT DETECTED
Benzodiazepines: NOT DETECTED
Benzodiazepines: NOT DETECTED
Cocaine: NOT DETECTED
Cocaine: NOT DETECTED
Cocaine: NOT DETECTED
Opiates: NOT DETECTED
Opiates: NOT DETECTED
Opiates: NOT DETECTED
Tetrahydrocannabinol: POSITIVE — AB

## 2011-08-18 LAB — HEPATITIS PANEL, ACUTE
Hep A IgM: NEGATIVE
Hep B C IgM: NEGATIVE

## 2011-08-18 LAB — URINE MICROSCOPIC-ADD ON

## 2011-08-18 LAB — BASIC METABOLIC PANEL
BUN: 7
CO2: 26
Calcium: 9.4
Chloride: 107
Creatinine, Ser: 1.24
GFR calc Af Amer: >60
GFR calc non Af Amer: >60
Glucose, Bld: 104 — ABNORMAL HIGH
Potassium: 3.4 — ABNORMAL LOW
Sodium: 140

## 2011-08-18 LAB — ETHANOL: Alcohol, Ethyl (B): <5

## 2011-08-18 LAB — GC/CHLAMYDIA PROBE AMP, URINE: Chlamydia, Swab/Urine, PCR: NEGATIVE

## 2011-08-18 LAB — HEPATIC FUNCTION PANEL
ALT: 16
AST: 18
Albumin: 4.1
Total Bilirubin: 0.4

## 2011-08-22 LAB — RAPID URINE DRUG SCREEN, HOSP PERFORMED
Barbiturates: NOT DETECTED
Benzodiazepines: NOT DETECTED

## 2011-08-22 LAB — BASIC METABOLIC PANEL
CO2: 28
Chloride: 104
Creatinine, Ser: 0.87
GFR calc Af Amer: >60
Potassium: 3.7

## 2011-08-22 LAB — DIFFERENTIAL
Eosinophils Relative: 1
Lymphocytes Relative: 21
Lymphs Abs: 3
Monocytes Absolute: 0.9
Neutro Abs: 10 — ABNORMAL HIGH

## 2011-08-22 LAB — CBC
HCT: 45.9
Hemoglobin: 15.6
MCHC: 33.9
MCV: 89.8
RBC: 5.11
WBC: 14.1 — ABNORMAL HIGH

## 2011-08-23 LAB — URINALYSIS, ROUTINE W REFLEX MICROSCOPIC
Glucose, UA: NEGATIVE
Hgb urine dipstick: NEGATIVE
Ketones, ur: NEGATIVE
Protein, ur: NEGATIVE
Urobilinogen, UA: 1

## 2011-08-23 LAB — RAPID URINE DRUG SCREEN, HOSP PERFORMED
Amphetamines: NOT DETECTED
Benzodiazepines: NOT DETECTED
Cocaine: NOT DETECTED

## 2013-09-08 ENCOUNTER — Emergency Department: Payer: Self-pay | Admitting: Internal Medicine

## 2013-09-08 LAB — COMPREHENSIVE METABOLIC PANEL
Albumin: 3.8 g/dL (ref 3.4–5.0)
Alkaline Phosphatase: 75 U/L (ref 50–136)
BUN: 4 mg/dL — ABNORMAL LOW (ref 7–18)
Calcium, Total: 8.2 mg/dL — ABNORMAL LOW (ref 8.5–10.1)
Chloride: 109 mmol/L — ABNORMAL HIGH (ref 98–107)
EGFR (African American): >60
EGFR (Non-African Amer.): >60
Potassium: 3.7 mmol/L (ref 3.5–5.1)
Total Protein: 7.1 g/dL (ref 6.4–8.2)

## 2013-09-08 LAB — URINALYSIS, COMPLETE
Bacteria: NONE SEEN
Glucose,UR: NEGATIVE mg/dL (ref 0–75)
Nitrite: NEGATIVE
Protein: NEGATIVE
RBC,UR: 1 /HPF (ref 0–5)
Specific Gravity: 1.005 (ref 1.003–1.030)
WBC UR: 2 /HPF (ref 0–5)

## 2013-09-08 LAB — CBC
HGB: 14.8 g/dL (ref 13.0–18.0)
MCH: 30.6 pg (ref 26.0–34.0)
MCHC: 34.1 g/dL (ref 32.0–36.0)
Platelet: 314 10*3/uL (ref 150–440)
RDW: 13.1 % (ref 11.5–14.5)
WBC: 16.7 10*3/uL — ABNORMAL HIGH (ref 3.8–10.6)

## 2013-09-08 LAB — DRUG SCREEN, URINE
Benzodiazepine, Ur Scrn: NEGATIVE (ref ?–200)
Cannabinoid 50 Ng, Ur ~~LOC~~: NEGATIVE (ref ?–50)
Cocaine Metabolite,Ur ~~LOC~~: NEGATIVE (ref ?–300)
MDMA (Ecstasy)Ur Screen: NEGATIVE (ref ?–500)
Methadone, Ur Screen: NEGATIVE (ref ?–300)
Phencyclidine (PCP) Ur S: NEGATIVE (ref ?–25)

## 2013-09-08 LAB — ETHANOL: Ethanol %: 0.094 % — ABNORMAL HIGH (ref 0.000–0.080)

## 2013-09-11 ENCOUNTER — Emergency Department: Payer: Self-pay | Admitting: Internal Medicine

## 2013-09-14 ENCOUNTER — Emergency Department: Payer: Self-pay | Admitting: Emergency Medicine

## 2013-09-16 ENCOUNTER — Emergency Department (HOSPITAL_COMMUNITY)
Admission: EM | Admit: 2013-09-16 | Discharge: 2013-09-17 | Disposition: A | Discharge status: Other Acute Inpatient Hospital | Payer: Self-pay | Attending: Emergency Medicine | Admitting: Emergency Medicine

## 2013-09-16 ENCOUNTER — Emergency Department (HOSPITAL_COMMUNITY): Payer: Self-pay

## 2013-09-16 DIAGNOSIS — F3289 Other specified depressive episodes: Secondary | ICD-10-CM | POA: Insufficient documentation

## 2013-09-16 DIAGNOSIS — T50992A Poisoning by other drugs, medicaments and biological substances, intentional self-harm, initial encounter: Secondary | ICD-10-CM | POA: Insufficient documentation

## 2013-09-16 DIAGNOSIS — T391X1A Poisoning by 4-Aminophenol derivatives, accidental (unintentional), initial encounter: Secondary | ICD-10-CM | POA: Insufficient documentation

## 2013-09-16 DIAGNOSIS — T50901A Poisoning by unspecified drugs, medicaments and biological substances, accidental (unintentional), initial encounter: Secondary | ICD-10-CM | POA: Insufficient documentation

## 2013-09-16 DIAGNOSIS — R45851 Suicidal ideations: Secondary | ICD-10-CM | POA: Insufficient documentation

## 2013-09-16 DIAGNOSIS — T394X2A Poisoning by antirheumatics, not elsewhere classified, intentional self-harm, initial encounter: Secondary | ICD-10-CM | POA: Insufficient documentation

## 2013-09-16 DIAGNOSIS — F39 Unspecified mood [affective] disorder: Secondary | ICD-10-CM | POA: Insufficient documentation

## 2013-09-16 DIAGNOSIS — F329 Major depressive disorder, single episode, unspecified: Secondary | ICD-10-CM | POA: Insufficient documentation

## 2013-09-16 DIAGNOSIS — T450X4A Poisoning by antiallergic and antiemetic drugs, undetermined, initial encounter: Secondary | ICD-10-CM | POA: Insufficient documentation

## 2013-09-16 DIAGNOSIS — Z87828 Personal history of other (healed) physical injury and trauma: Secondary | ICD-10-CM | POA: Insufficient documentation

## 2013-09-16 DIAGNOSIS — R443 Hallucinations, unspecified: Secondary | ICD-10-CM | POA: Insufficient documentation

## 2013-09-16 DIAGNOSIS — Z792 Long term (current) use of antibiotics: Secondary | ICD-10-CM | POA: Insufficient documentation

## 2013-09-16 LAB — RAPID URINE DRUG SCREEN, HOSP PERFORMED
Amphetamines: NOT DETECTED
Barbiturates: NOT DETECTED
Benzodiazepines: NOT DETECTED
Cocaine: NOT DETECTED
Opiates: NOT DETECTED
Tetrahydrocannabinol: POSITIVE — AB

## 2013-09-16 LAB — CBC
HCT: 41.3 % (ref 39.0–52.0)
Hemoglobin: 13.8 g/dL (ref 13.0–17.0)
MCH: 30.4 pg (ref 26.0–34.0)
MCHC: 33.4 g/dL (ref 30.0–36.0)
MCV: 91 fL (ref 78.0–100.0)
Platelets: 387 10*3/uL (ref 150–400)
RBC: 4.54 MIL/uL (ref 4.22–5.81)
RDW: 13.1 % (ref 11.5–15.5)
WBC: 7.9 10*3/uL (ref 4.0–10.5)

## 2013-09-16 LAB — URINALYSIS, ROUTINE W REFLEX MICROSCOPIC
Bilirubin Urine: NEGATIVE
Glucose, UA: NEGATIVE mg/dL
Hgb urine dipstick: NEGATIVE
Ketones, ur: NEGATIVE mg/dL
Leukocytes, UA: NEGATIVE
Nitrite: NEGATIVE
Protein, ur: NEGATIVE mg/dL
Specific Gravity, Urine: 1.018 (ref 1.005–1.030)
Urobilinogen, UA: 1 mg/dL (ref 0.0–1.0)
pH: 7.5 (ref 5.0–8.0)

## 2013-09-16 LAB — SALICYLATE LEVEL: Salicylate Lvl: <2 mg/dL — ABNORMAL LOW (ref 2.8–20.0)

## 2013-09-16 LAB — BASIC METABOLIC PANEL
BUN: 7 mg/dL (ref 6–23)
CO2: 23 mEq/L (ref 19–32)
Calcium: 9.2 mg/dL (ref 8.4–10.5)
Chloride: 102 mEq/L (ref 96–112)
Creatinine, Ser: 0.72 mg/dL (ref 0.50–1.35)
GFR calc Af Amer: >90 mL/min (ref >90)
GFR calc non Af Amer: >90 mL/min (ref >90)
Glucose, Bld: 79 mg/dL (ref 70–99)
Potassium: 3.5 mEq/L (ref 3.5–5.1)
Sodium: 137 mEq/L (ref 135–145)

## 2013-09-16 LAB — ACETAMINOPHEN LEVEL: Acetaminophen (Tylenol), Serum: <15 ug/mL (ref 10–30)

## 2013-09-16 LAB — ETHANOL: Alcohol, Ethyl (B): <11 mg/dL (ref 0–11)

## 2013-09-16 MED ORDER — HYDROCODONE-ACETAMINOPHEN 5-325 MG PO TABS
1.0000 | ORAL_TABLET | Freq: Once | ORAL | Status: AC
  Administered 2013-09-16: 1 via ORAL
  Filled 2013-09-16: qty 1

## 2013-09-16 MED ORDER — BACITRACIN ZINC 500 UNIT/GM EX OINT
TOPICAL_OINTMENT | CUTANEOUS | Status: AC
  Filled 2013-09-16: qty 0.9

## 2013-09-16 MED ORDER — BACITRACIN ZINC 500 UNIT/GM EX OINT: 1.0000 "application " | TOPICAL_OINTMENT | Freq: Two times a day (BID) | CUTANEOUS | Status: DC

## 2013-09-16 MED ORDER — OXYCODONE-ACETAMINOPHEN 5-325 MG PO TABS: 1.0000 | ORAL_TABLET | Freq: Once | ORAL | Status: DC
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
## 2013-09-17 ENCOUNTER — Encounter (HOSPITAL_COMMUNITY): Payer: Self-pay | Admitting: *Deleted

## 2013-09-17 ENCOUNTER — Inpatient Hospital Stay (HOSPITAL_COMMUNITY)
Admission: AD | Admit: 2013-09-17 | Discharge: 2013-09-18 | DRG: 885 | Disposition: A | Discharge status: Home / Self Care | Payer: Federal, State, Local not specified - Other | Source: Intra-hospital | Attending: Psychiatry | Admitting: Psychiatry

## 2013-09-17 DIAGNOSIS — F1920 Other psychoactive substance dependence, uncomplicated: Secondary | ICD-10-CM | POA: Diagnosis present

## 2013-09-17 DIAGNOSIS — F332 Major depressive disorder, recurrent severe without psychotic features: Secondary | ICD-10-CM

## 2013-09-17 DIAGNOSIS — F1910 Other psychoactive substance abuse, uncomplicated: Secondary | ICD-10-CM

## 2013-09-17 DIAGNOSIS — F333 Major depressive disorder, recurrent, severe with psychotic symptoms: Principal | ICD-10-CM | POA: Diagnosis present

## 2013-09-17 DIAGNOSIS — F1010 Alcohol abuse, uncomplicated: Secondary | ICD-10-CM

## 2013-09-17 DIAGNOSIS — F1994 Other psychoactive substance use, unspecified with psychoactive substance-induced mood disorder: Secondary | ICD-10-CM

## 2013-09-17 MED ORDER — TRAZODONE HCL 50 MG PO TABS
50.0000 mg | ORAL_TABLET | Freq: Every day | ORAL | Status: DC
  Filled 2013-09-17 (×2): qty 1

## 2013-09-17 MED ORDER — PNEUMOCOCCAL VAC POLYVALENT 25 MCG/0.5ML IJ INJ: 0.5000 mL | INJECTION | INTRAMUSCULAR | Status: DC

## 2013-09-17 MED ORDER — ALUM & MAG HYDROXIDE-SIMETH 200-200-20 MG/5ML PO SUSP: 30.0000 mL | ORAL | Status: DC | PRN

## 2013-09-17 MED ORDER — NICOTINE POLACRILEX 2 MG MT GUM
2.0000 mg | CHEWING_GUM | OROMUCOSAL | Status: DC | PRN
  Administered 2013-09-17: 2 mg via ORAL
  Filled 2013-09-17: qty 1

## 2013-09-17 MED ORDER — INFLUENZA VAC SPLIT QUAD 0.5 ML IM SUSP
0.5000 mL | INTRAMUSCULAR | Status: DC
  Filled 2013-09-17: qty 0.5

## 2013-09-17 MED ORDER — ACETAMINOPHEN 325 MG PO TABS
650.0000 mg | ORAL_TABLET | Freq: Four times a day (QID) | ORAL | Status: DC | PRN
  Administered 2013-09-17: 650 mg via ORAL
  Filled 2013-09-17: qty 2

## 2013-09-17 MED ORDER — MAGNESIUM HYDROXIDE 400 MG/5ML PO SUSP: 30.0000 mL | Freq: Every day | ORAL | Status: DC | PRN

## 2013-09-17 NOTE — ED Notes (Signed)
Patient DC to [facility].  Patient not showing any signs of distress at time of DC
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
## 2013-09-17 NOTE — Progress Notes (Signed)
Psychoeducational Group Note  Date:  09/17/2013 Time:  1100  Group Topic/Focus:  Personal Choices and Values:   The focus of this group is to help patients assess and explore the importance of values in their lives, how their values affect their decisions, how they express their values and what opposes their expression.  Participation Level: Did Not Attend  Participation Quality:  Not Applicable  Affect:  Not Applicable  Cognitive:  Not Applicable  Insight:  Not Applicable  Engagement in Group: Not Applicable  Additional Comments:  [name] did not attend group, patient remained in bed.  [name] Brittini 09/17/2013, 6:47 PM

## 2013-09-17 NOTE — [facility] Group Notes (Signed)
[facility] LCSW Group Therapy  09/17/2013 3:05 PM  Type of Therapy:  Group Therapy  Participation Level:  Did Not Attend  Pt met with MD during group session.   Smart, Shyan Scalisi 09/17/2013, 3:05 PM
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
## 2013-09-17 NOTE — [facility] Group Notes (Signed)
[facility] LCSW Aftercare Discharge Planning Group Note   09/17/2013 9:48 AM  Participation Quality:  DID NOT ATTEND    Smart, Herbert Seta

## 2013-09-17 NOTE — [facility] Suicide Risk Assessment (Signed)
Suicide Risk Assessment  Admission Assessment     Nursing information obtained from:  Patient Demographic factors:  Male;Adolescent or young adult;Caucasian;Low socioeconomic status;Unemployed Current Mental Status:  NA Loss Factors:  Loss of significant relationship Historical Factors:  Prior suicide attempts;Family history of mental illness or substance abuse;Impulsivity Risk Reduction Factors:  Responsible for children under 24 years of age;Sense of responsibility to family;Religious beliefs about death  CLINICAL FACTORS:   Depression:   Comorbid alcohol abuse/dependence Alcohol/Substance Abuse/Dependencies  COGNITIVE FEATURES THAT CONTRIBUTE TO RISK:  Closed-mindedness Polarized thinking Thought constriction (tunnel vision)    SUICIDE RISK:   Moderate:  Frequent suicidal ideation with limited intensity, and duration, some specificity in terms of plans, no associated intent, good self-control, limited dysphoria/symptomatology, some risk factors present, and identifiable protective factors, including available and accessible social support.  PLAN OF CARE: Supportive approach/coping skills/relapse prevention                              Reassess for detox needs, reassess co morbidities  I certify that inpatient services furnished can reasonably be expected to improve the patient's condition.  Karlina Suares A 09/17/2013, 4:46 PM

## 2013-09-17 NOTE — Progress Notes (Signed)
D: Patient denies SI/HI and A/V hallucinations; patient reports that he refuses to wear the boot that was placed on his ankle because it does not offer support after the splint was taken off the emergency department  A: Monitored q 15 minutes; patient encouraged to attend groups; patient educated about medications; patient given medications per physician orders; patient encouraged to express feelings and/or concerns  R: Patient was rude and sarcastic this morning and now he is animated and constantly wants you to bend rules for him; patient polite about things now but has constant request; patient's interaction with staff and peers is appropriate and he constantly insists that he does not need to be here; patient was able to set goal to talk with staff 1:1 when having feelings of SI; patient only wants medications for his pain and wants his nicotine gum; patient is not attending any groups

## 2013-09-17 NOTE — Tx Team (Signed)
Initial Interdisciplinary Treatment Plan  PATIENT STRENGTHS: (choose at least two) Ability for insight Active sense of humor Average or above average intelligence Communication skills General fund of knowledge Motivation for treatment/growth Physical Health Supportive family/friends  PATIENT STRESSORS: Health problems Substance abuse   PROBLEM LIST: Problem List/Patient Goals Date to be addressed Date deferred Reason deferred Estimated date of resolution  ""Cause I need to stop fucking up" 09/17/13     "Drinking and doing drugs" 09/17/13           Substance abuse 09/17/13     Depression 09/17/13     Increased risk for suicide 09/17/13                        DISCHARGE CRITERIA:  Ability to meet basic life and health needs Adequate post-discharge living arrangements Improved stabilization in mood, thinking, and/or behavior Medical problems require only outpatient monitoring Motivation to continue treatment in a less acute level of care Need for constant or close observation no longer present Reduction of life-threatening or endangering symptoms to within safe limits Safe-care adequate arrangements made Verbal commitment to aftercare and medication compliance Withdrawal symptoms are absent or subacute and managed without 24-hour nursing intervention  PRELIMINARY DISCHARGE PLAN: Attend PHP/IOP Attend 12-step recovery group Outpatient therapy Participate in family therapy Placement in alternative living arrangements  PATIENT/FAMIILY INVOLVEMENT: This treatment plan has been presented to and reviewed with the patient, [name], and/or family member.  The patient and family have been given the opportunity to ask questions and make suggestions.  [name] 09/17/2013, 2:04 AM

## 2013-09-18 ENCOUNTER — Emergency Department: Payer: Self-pay | Admitting: Emergency Medicine

## 2013-09-18 DIAGNOSIS — F39 Unspecified mood [affective] disorder: Secondary | ICD-10-CM

## 2013-09-18 LAB — DRUG SCREEN, URINE
Amphetamines, Ur Screen: NEGATIVE (ref ?–1000)
Benzodiazepine, Ur Scrn: NEGATIVE (ref ?–200)
Cannabinoid 50 Ng, Ur ~~LOC~~: POSITIVE (ref ?–50)
Cocaine Metabolite,Ur ~~LOC~~: NEGATIVE (ref ?–300)
MDMA (Ecstasy)Ur Screen: NEGATIVE (ref ?–500)
Methadone, Ur Screen: NEGATIVE (ref ?–300)
Phencyclidine (PCP) Ur S: NEGATIVE (ref ?–25)
Tricyclic, Ur Screen: NEGATIVE (ref ?–1000)

## 2013-09-18 LAB — COMPREHENSIVE METABOLIC PANEL
Albumin: 3.7 g/dL (ref 3.4–5.0)
Alkaline Phosphatase: 88 U/L (ref 50–136)
Anion Gap: 4 — ABNORMAL LOW (ref 7–16)
BUN: 10 mg/dL (ref 7–18)
Bilirubin,Total: 0.3 mg/dL (ref 0.2–1.0)
Calcium, Total: 8.7 mg/dL (ref 8.5–10.1)
Co2: 29 mmol/L (ref 21–32)
EGFR (African American): >60
Osmolality: 280 (ref 275–301)
SGOT(AST): 13 U/L — ABNORMAL LOW (ref 15–37)
SGPT (ALT): 18 U/L (ref 12–78)

## 2013-09-18 LAB — CBC
MCH: 31.6 pg (ref 26.0–34.0)
MCHC: 35 g/dL (ref 32.0–36.0)
Platelet: 379 10*3/uL (ref 150–440)
RDW: 13.5 % (ref 11.5–14.5)

## 2013-09-18 LAB — URINALYSIS, COMPLETE
Bacteria: NONE SEEN
Bilirubin,UR: NEGATIVE
Blood: NEGATIVE
Glucose,UR: NEGATIVE mg/dL (ref 0–75)
Nitrite: NEGATIVE
Ph: 6 (ref 4.5–8.0)
Protein: 30
RBC,UR: 1 /HPF (ref 0–5)
Squamous Epithelial: <1
WBC UR: 2 /HPF (ref 0–5)

## 2013-09-18 LAB — TSH: Thyroid Stimulating Horm: 2.48 u[IU]/mL

## 2013-09-18 LAB — ETHANOL
Ethanol %: 0.003 % (ref 0.000–0.080)
Ethanol: 3 mg/dL

## 2013-09-18 MED ORDER — TRAZODONE HCL 50 MG PO TABS: 50.0000 mg | ORAL_TABLET | Freq: Every day | ORAL | Status: DC

## 2013-09-18 NOTE — Progress Notes (Signed)
D:Per charge nurse, PT contacted her and was instructed to read MD note on 10/28. After reading note, Clinical research associate contacted [employer] and was instructed to give pt's shoe size. Writer went to pt's room to get information. Pt refused to give information stating that he did not want a boot/cam walker. "I will go to a good hospital and get what I want." Pt is irritable and answers in short sarcastic answers. When asked about suicidal thoughts, "I am not, thank you." Pt denied HI and requested nurse to leave room.   A:Offered support and 15 minute checks. R:Safety maintained.

## 2013-09-18 NOTE — Plan of Care (Signed)
Problem: Alteration in mood Goal: LTG-Patient reports reduction in suicidal thoughts (Patient reports reduction in suicidal thoughts and is able to verbalize a safety plan for whenever patient is feeling suicidal)  Outcome: Completed/Met Date Met:  09/18/13 Pt denies si and hi Goal: STG-Patient reports thoughts of self-harm to staff Outcome: Completed/Met Date Met:  09/18/13 Pt denies si Goal: STG-Pt Able to Identify Plan For Continuing Care at D/C Pt. Will be able to identify a plan for continuing care at discharge  Outcome: Completed/Met Date Met:  09/18/13 Pt given discharge instructions and appt made.  Problem: Ineffective individual coping Goal: LTG: Patient will report a decrease in negative feelings Outcome: Completed/Met Date Met:  09/18/13 Pt denies si and hi  Problem: Diagnosis: Increased Risk For Suicide Attempt Goal: LTG-Patient will be able to identify a plan to address LTG - Patient will be able to identify a plan to address suicidal feelings after discharge  Outcome: Completed/Met Date Met:  09/18/13 Pt denies si  Goal: STG-Patient Will Report Suicidal Feelings to Staff Outcome: Completed/Met Date Met:  09/18/13 Pt denies si  Problem: Alteration in mood & ability to function due to Goal: LTG-Pt reports reduction in suicidal thoughts (Patient reports reduction in suicidal thoughts and is able to verbalize a safety plan for whenever patient is feeling suicidal)  Outcome: Completed/Met Date Met:  09/18/13 Pt denies si Goal: STG-Patient will attend groups Outcome: Not Met (add Reason) Pt refuses groups
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
## 2013-09-18 NOTE — [facility] Suicide Risk Assessment (Signed)
[facility] INPATIENT:  Family/Significant Other Suicide Prevention Education  Suicide Prevention Education:  Education Completed; [name] (pt's mother) [phone] has been identified by the patient as the family member/significant other with whom the patient will be residing, and identified as the person(s) who will aid the patient in the event of a mental health crisis (suicidal ideations/suicide attempt).  With written consent from the patient, the family member/significant other has been provided the following suicide prevention education, prior to the and/or following the discharge of the patient.  The suicide prevention education provided includes the following:  Suicide risk factors  Suicide prevention and interventions  National Suicide Hotline telephone number  [facility] assessment telephone number  [facility] Emergency Assistance 911  [facility] and/or Residential Mobile Crisis Unit telephone number  Request made of family/significant other to:  Remove weapons (e.g., guns, rifles, knives), all items previously/currently identified as safety concern.    Remove drugs/medications (over-the-counter, prescriptions, illicit drugs), all items previously/currently identified as a safety concern.  The family member/significant other verbalizes understanding of the suicide prevention education information provided.  The family member/significant other agrees to remove the items of safety concern listed above.  [name], [name] 09/18/2013, 8:54 AM

## 2013-09-18 NOTE — Progress Notes (Signed)
[facility] Adult Case Management Discharge Plan :  Will you be returning to the same living situation after discharge: Yes,  home with girlfriend At discharge, do you have transportation home?:Yes,  bus pass provided.  Do you have the ability to pay for your medications:Yes,  mental health  Release of information consent forms completed and in the chart;  Patient's signature needed at discharge.  Patient to Follow up at: Follow-up Information   [facility]  On [facility]/2013. (Appt at 11AM with Rudi Rummage for therapy. )    Contact information:   [facility] [address], [name] [address] Phone: [phone] Fax: [phone]      Patient denies SI/HI:   Yes,  self report.    Safety Planning and Suicide Prevention discussed:  Yes,  SPE completed with pt's mother. SPI pamphlet given to pt and he was encouraged to share this information with support network, ask questions, and talk about concerns.   Smart, Stacey Maura 09/18/2013, 12:30 PM

## 2013-09-18 NOTE — Progress Notes (Signed)
PHYSICAL THERAPY NOTE.Spoke to pt's RN for today and recommended reviewing the [facility] ED note form 09/16/13 from Dr. Rolland Porter re: Cam boot that was placed on pt. Contact orthopedic technician if new splint is required. PT will sign off at this time.Blanchard Kelch PT [phone]

## 2013-09-18 NOTE — [facility] Suicide Risk Assessment (Signed)
Suicide Risk Assessment  Discharge Assessment     Demographic Factors:  Male and Caucasian  Mental Status Per Nursing Assessment::   On Admission:  NA  Current Mental Status by Physician: In full contact with reality. No evidence of withdrawal. No suicidal ideas, plans or intent. His mood is euthymic, his affect is appropriate. States he wants to be D/C today. He is going to follow up with an orthopedist on an outpatient basis. Continues to deny this was a suicidal attempt and continues to minimize his substance abuse   Loss Factors: NA  Historical Factors: NA  Risk Reduction Factors:   wants to do better, has some support out there  Continued Clinical Symptoms:  Alcohol/Substance Abuse/Dependencies  Cognitive Features That Contribute To Risk:  Polarized thinking Thought constriction (tunnel vision)   Close mindness Suicide Risk:  Minimal: No identifiable suicidal ideation.  Patients presenting with no risk factors but with morbid ruminations; may be classified as minimal risk based on the severity of the depressive symptoms  Discharge Diagnoses:   AXIS I:  Polysubstance Dependence, Substance Induced Mood Disorder AXIS II:  Deferred AXIS III:  History reviewed. No pertinent past medical history. AXIS IV:  other psychosocial or environmental problems AXIS V:  61-70 mild symptoms  Plan Of Care/Follow-up recommendations:  Activity:  as tolerated Diet:  regular Follow up outpatient basis Is patient on multiple antipsychotic therapies at discharge:  No   Has Patient had three or more failed trials of antipsychotic monotherapy by history:  No  Recommended Plan for Multiple Antipsychotic Therapies: NA  Mahasin Riviere A 09/18/2013, 12:55 PM

## 2013-09-18 NOTE — Progress Notes (Signed)
Pt d/c from hospital with two bus passes and security to escort to bus stop. Pt is wearing a boot of lt foot. All items returned. Pt refused trazadone. Pt reports that he does not plan to go to follow up appt on 09/23/13 because a new play station game is suppose to be released that day and he plans on playing the game all day. Reinforced the necessity of follow up and told pt to call [facility] if he was going to miss or needed to change the appt. Pt denies si and hi.

## 2013-09-20 ENCOUNTER — Emergency Department (HOSPITAL_COMMUNITY): Payer: Self-pay

## 2013-09-20 ENCOUNTER — Encounter (HOSPITAL_COMMUNITY): Payer: Self-pay | Admitting: Emergency Medicine

## 2013-09-20 ENCOUNTER — Emergency Department (HOSPITAL_COMMUNITY)
Admission: EM | Admit: 2013-09-20 | Discharge: 2013-09-21 | Disposition: A | Discharge status: Home / Self Care | Payer: Federal, State, Local not specified - Other | Attending: Emergency Medicine | Admitting: Emergency Medicine

## 2013-09-20 DIAGNOSIS — R609 Edema, unspecified: Secondary | ICD-10-CM | POA: Insufficient documentation

## 2013-09-20 DIAGNOSIS — Z87828 Personal history of other (healed) physical injury and trauma: Secondary | ICD-10-CM | POA: Insufficient documentation

## 2013-09-20 DIAGNOSIS — F1920 Other psychoactive substance dependence, uncomplicated: Secondary | ICD-10-CM

## 2013-09-20 DIAGNOSIS — F333 Major depressive disorder, recurrent, severe with psychotic symptoms: Secondary | ICD-10-CM | POA: Insufficient documentation

## 2013-09-20 DIAGNOSIS — F1220 Cannabis dependence, uncomplicated: Secondary | ICD-10-CM | POA: Insufficient documentation

## 2013-09-20 DIAGNOSIS — F17200 Nicotine dependence, unspecified, uncomplicated: Secondary | ICD-10-CM | POA: Insufficient documentation

## 2013-09-20 DIAGNOSIS — M25419 Effusion, unspecified shoulder: Secondary | ICD-10-CM | POA: Insufficient documentation

## 2013-09-20 LAB — RAPID URINE DRUG SCREEN, HOSP PERFORMED
Opiates: NOT DETECTED
Tetrahydrocannabinol: POSITIVE — AB

## 2013-09-20 LAB — CBC WITH DIFFERENTIAL/PLATELET
Basophils Absolute: 0 10*3/uL (ref 0.0–0.1)
Basophils Relative: 0 % (ref 0–1)
Eosinophils Absolute: 0 10*3/uL (ref 0.0–0.7)
HCT: 44 % (ref 39.0–52.0)
Lymphocytes Relative: 19 % (ref 12–46)
MCH: 31.8 pg (ref 26.0–34.0)
MCHC: 35.5 g/dL (ref 30.0–36.0)
Monocytes Absolute: 1 10*3/uL (ref 0.1–1.0)
Monocytes Relative: 9 % (ref 3–12)
Neutro Abs: 8.1 10*3/uL — ABNORMAL HIGH (ref 1.7–7.7)
Neutrophils Relative %: 71 % (ref 43–77)
Platelets: 398 10*3/uL (ref 150–400)
RBC: 4.91 MIL/uL (ref 4.22–5.81)
RDW: 13.1 % (ref 11.5–15.5)
WBC: 11.4 10*3/uL — ABNORMAL HIGH (ref 4.0–10.5)

## 2013-09-20 LAB — POCT I-STAT, CHEM 8
BUN: 4 mg/dL — ABNORMAL LOW (ref 6–23)
Calcium, Ion: 1.12 mmol/L (ref 1.12–1.23)
Chloride: 104 mEq/L (ref 96–112)
Glucose, Bld: 78 mg/dL (ref 70–99)
HCT: 48 % (ref 39.0–52.0)
Potassium: 3.4 mEq/L — ABNORMAL LOW (ref 3.5–5.1)
TCO2: 25 mmol/L (ref 0–100)

## 2013-09-20 LAB — ETHANOL: Alcohol, Ethyl (B): <11 mg/dL (ref 0–11)

## 2013-09-20 MED ORDER — IBUPROFEN 800 MG PO TABS
800.0000 mg | ORAL_TABLET | Freq: Once | ORAL | Status: AC
  Administered 2013-09-20: 800 mg via ORAL
  Filled 2013-09-20: qty 1

## 2013-09-20 MED ORDER — NICOTINE 21 MG/24HR TD PT24
21.0000 mg | MEDICATED_PATCH | Freq: Every day | TRANSDERMAL | Status: DC
  Administered 2013-09-20: 21 mg via TRANSDERMAL
  Filled 2013-09-20: qty 1

## 2013-09-20 MED ORDER — ONDANSETRON HCL 4 MG PO TABS: 4.0000 mg | ORAL_TABLET | Freq: Three times a day (TID) | ORAL | Status: DC | PRN
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
## 2013-09-20 NOTE — Progress Notes (Addendum)
[facility]: [id number] Faxed referral for placement. @1652  no beds available today [facility]: Referral Faxed [facility]: Referral Faxed [facility]: @1820  Per Darl Pikes No Beds available  Deatra James, [name]
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
## 2013-09-20 NOTE — ED Provider Notes (Signed)
Medical screening examination/treatment/procedure(s) were conducted as a shared visit with non-physician practitioner(s) and myself.  I personally evaluated the patient during the encounter.  EKG Interpretation   None      Patient here with drug use. Recent admission and subsequent discharge to [facility]. He wants detox today. He has been walking around on his comminuted bimalleolar fracture. Xrays today redemonstrate widened ankle joint. Foot swollen, NVI, good cap refill.  Dr. Lajoyce Corners with Ortho will evaluate, TTS consulted.     Dagmar Hait, MD 09/20/13 [id number]

## 2013-09-20 NOTE — BH Assessment (Signed)
TA scheduled for this patient at 0915. Writer contacted EDP-Dr. Denton Lank prior to seeing this patient to obtain clinicals.

## 2013-09-20 NOTE — ED Notes (Signed)
Patient transported to X-ray 

## 2013-09-20 NOTE — ED Provider Notes (Signed)
Signed out by Elwin Mocha that ortho and psych assessment pending.  Dr Lajoyce Corners has seen, discussed tx options w pt, and he has elected to place in cast w outpt ortho follow up.  Recheck post cast. Pain controlled. Normal color and cap refill distally in toes.   Psych team has assessed and states they are working on inpatient placement.  Signed out to oncoming provider, Dr Loretha Stapler,  the plan.  As pt has crutches, unable to move to psych ed awaiting placement.  Will need ortho follow up as well.    Suzi Roots, MD 09/20/13 1536

## 2013-09-20 NOTE — BH Assessment (Signed)
Disposition pending a evaluation by Julieanne Cotton, NP for further recommendations. Writer notified EDP-Dr. Denton Lank of patient's pending disposition plan.

## 2013-09-20 NOTE — ED Notes (Signed)
Bed: WA06 Expected date:  Expected time:  Means of arrival:  Comments: EMS/wants detox from [facility]

## 2013-09-20 NOTE — ED Provider Notes (Signed)
Pt signed out to me by Elsie Stain, NP at shift change. TTS evaluation pending.  Needs splint put on leg, eventually.  Dr. Gwendolyn Grant consulted Dr. Lajoyce Corners, orthopedics, who will come see pt.  Dr. Lajoyce Corners, orthopedics, evaluated and spoke with pt.  Pt will be placed in hard cast and advised to f/u with orthopedics.   TTS still evaluating pt.  BH will determine pt's disposition.  Junius Finner, PA-C 09/20/13 [phone]
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
## 2013-09-20 NOTE — ED Notes (Signed)
Note:  Pt. Has been quite cooperative and in no distress.  He ambulates well with crutches.  At this time I place his belongings (2 bags; one with clothing and one with orthopedic appliance) in locker #30.
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
## 2013-09-21 ENCOUNTER — Emergency Department (HOSPITAL_COMMUNITY)
Admission: EM | Admit: 2013-09-21 | Discharge: 2013-09-22 | Discharge status: Against Medical Advice | Payer: Self-pay | Attending: Emergency Medicine | Admitting: Emergency Medicine

## 2013-09-21 ENCOUNTER — Encounter (HOSPITAL_COMMUNITY): Payer: Self-pay | Admitting: Emergency Medicine

## 2013-09-21 ENCOUNTER — Encounter (HOSPITAL_COMMUNITY): Payer: Self-pay | Admitting: Registered Nurse

## 2013-09-21 DIAGNOSIS — F1910 Other psychoactive substance abuse, uncomplicated: Secondary | ICD-10-CM

## 2013-09-21 DIAGNOSIS — F332 Major depressive disorder, recurrent severe without psychotic features: Secondary | ICD-10-CM | POA: Insufficient documentation

## 2013-09-21 DIAGNOSIS — Z79899 Other long term (current) drug therapy: Secondary | ICD-10-CM | POA: Insufficient documentation

## 2013-09-21 DIAGNOSIS — R45851 Suicidal ideations: Secondary | ICD-10-CM

## 2013-09-21 DIAGNOSIS — T50992A Poisoning by other drugs, medicaments and biological substances, intentional self-harm, initial encounter: Secondary | ICD-10-CM | POA: Insufficient documentation

## 2013-09-21 DIAGNOSIS — Z8781 Personal history of (healed) traumatic fracture: Secondary | ICD-10-CM | POA: Insufficient documentation

## 2013-09-21 DIAGNOSIS — F1994 Other psychoactive substance use, unspecified with psychoactive substance-induced mood disorder: Secondary | ICD-10-CM

## 2013-09-21 DIAGNOSIS — F1920 Other psychoactive substance dependence, uncomplicated: Secondary | ICD-10-CM

## 2013-09-21 DIAGNOSIS — F17200 Nicotine dependence, unspecified, uncomplicated: Secondary | ICD-10-CM | POA: Insufficient documentation

## 2013-09-21 DIAGNOSIS — T450X4A Poisoning by antiallergic and antiemetic drugs, undetermined, initial encounter: Secondary | ICD-10-CM | POA: Insufficient documentation

## 2013-09-21 DIAGNOSIS — T483X4A Poisoning by antitussives, undetermined, initial encounter: Secondary | ICD-10-CM | POA: Insufficient documentation

## 2013-09-21 DIAGNOSIS — F4310 Post-traumatic stress disorder, unspecified: Secondary | ICD-10-CM

## 2013-09-21 NOTE — ED Notes (Signed)
Pt cleared to be discharged. Given bus pass.

## 2013-09-21 NOTE — ED Notes (Addendum)
Pt requesting to leave. EDP and charge nurse at bedside, psychiatrist notified and will be over to speak with pt soon

## 2013-09-21 NOTE — ED Notes (Signed)
Pt standing at door, states he is ready to leave, is voluntary and wants his belongings. Charge nurse at bedside explaining to pt that he requested detox, process usually takes longer over weekend. Pt admits he is tired of being in hospital and does want detox, but "needs a cigarette". Offered pt nicotine patch which he refused, stating it did not take edge off. Pt asking if staff can "sneak him a cigarette" we explained to pt that we could not give him his cigarettes and if he leaves he cannot come back to his room, he will have to start the whole medical clearance process over. Pt responds "no one will know I left, just like y'all didn't known I had my phone in here the whole time".  Noted pt did have his cell phone and charger in room with him. Hospital phone in room as well. No vital signs had been documented in 8 hours Pt cooperative and states he will stay so EDP can speak with him

## 2013-09-21 NOTE — Consult Note (Signed)
Pt wanting to leave ED.  Psychiatry evaluating and will determine along with ER MD on dispo.

## 2013-09-21 NOTE — Progress Notes (Signed)
Underwriter attempted to locate inpatient treatment for pt a the following hospitals: 1)[facility]-declined no past SI 2)[facility]-declined; broken ankles and past SI 3)[facility]-declined  4)[facility]-faxed referral 5)[facility]-faxed referral 6)[facility]-faxed in referral; ZOXW#[id number] good til 09/26/13  Blain Pais, [name]/NS

## 2013-09-21 NOTE — ED Notes (Signed)
Psychiatrist at bedside

## 2013-09-21 NOTE — Consult Note (Signed)
Provided pt resources to follow up with per psychiatry recommendation.  Pt will be discharged.

## 2013-09-21 NOTE — ED Notes (Signed)
Unsure why pt was not discharged, moved off the floor at 1233

## 2013-09-21 NOTE — ED Notes (Signed)
Pt arrived to ED with a complaint of a drug overdose. Pt states he has taken "two boxes of cold medicine." Pt has a slip that states ingredients of boxes are Dextromethorphan HBr 30 mg;  Chlorpheniramine 4 mg. Pt seen here previous for suicidal ideations and extremity pain related to a right broken ankle
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
## 2013-09-21 NOTE — Progress Notes (Signed)
[facility] called back stating further review is needed by an RN prior to acceptance due to SI statement in the assessment in addition to the broken ankle. [facility] to call back in the morning regarding acceptance or decline of pt referral.  Blain Pais, [name]/NS

## 2013-09-21 NOTE — ED Provider Notes (Signed)
10:41 AM Patient wants to leave. Given vague psych notes, psych reconsulted. They evaluated patient and feel his is stable for voluntary discharge. He denies SI/HI to me, will follow up with outpatient psychiatry.  Audree Camel, MD 09/21/13 [phone]

## 2013-09-21 NOTE — ED Notes (Signed)
Pt is awaiting placement and has already consulted with [facility].

## 2013-09-21 NOTE — ED Notes (Signed)
Bed: ZO10 Expected date: 09/21/13 Expected time: 11:01 PM Means of arrival: Ambulance Comments: Bed 11, EMS, 22 M, Overdose

## 2013-09-21 NOTE — Progress Notes (Signed)
Underwriter spoke with Andrey Campanile at [facility] regarding pt LSD detox.  [facility] stated with pre authorization by [facility], would review referral.  [facility] authorization [id number] good for 09/21/13 to 09/23/13.  [facility] would like pt to know when accept, that if aggressive behaviors occurs while at facility will d/c pt.    Blain Pais, [name]/NS

## 2013-09-21 NOTE — ED Provider Notes (Signed)
Medical screening examination/treatment/procedure(s) were conducted as a shared visit with non-physician practitioner(s) and myself.  I personally evaluated the patient during the encounter.  EKG Interpretation   None       See note.   Suzi Roots, MD 09/21/13 [phone]
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
## 2013-09-22 DIAGNOSIS — F1910 Other psychoactive substance abuse, uncomplicated: Secondary | ICD-10-CM

## 2013-09-22 DIAGNOSIS — F1920 Other psychoactive substance dependence, uncomplicated: Secondary | ICD-10-CM | POA: Diagnosis present

## 2013-09-22 DIAGNOSIS — F1994 Other psychoactive substance use, unspecified with psychoactive substance-induced mood disorder: Secondary | ICD-10-CM

## 2013-09-22 DIAGNOSIS — R45851 Suicidal ideations: Secondary | ICD-10-CM | POA: Diagnosis present

## 2013-09-22 DIAGNOSIS — F4310 Post-traumatic stress disorder, unspecified: Secondary | ICD-10-CM

## 2013-09-22 HISTORY — DX: Other psychoactive substance dependence, uncomplicated: F19.20

## 2013-09-22 LAB — CBC
Hemoglobin: 14.7 g/dL (ref 13.0–17.0)
MCHC: 34.2 g/dL (ref 30.0–36.0)
MCV: 91.5 fL (ref 78.0–100.0)
Platelets: 395 10*3/uL (ref 150–400)
RDW: 13 % (ref 11.5–15.5)

## 2013-09-22 LAB — RAPID URINE DRUG SCREEN, HOSP PERFORMED
Amphetamines: NOT DETECTED
Barbiturates: NOT DETECTED
Benzodiazepines: NOT DETECTED
Cocaine: NOT DETECTED
Opiates: NOT DETECTED
Tetrahydrocannabinol: NOT DETECTED

## 2013-09-22 LAB — COMPREHENSIVE METABOLIC PANEL
ALT: 11 U/L (ref 0–53)
AST: 14 U/L (ref 0–37)
Albumin: 3.7 g/dL (ref 3.5–5.2)
Alkaline Phosphatase: 74 U/L (ref 39–117)
BUN: 11 mg/dL (ref 6–23)
GFR calc Af Amer: >90 mL/min (ref >90)
Glucose, Bld: 116 mg/dL — ABNORMAL HIGH (ref 70–99)
Potassium: 3 mEq/L — ABNORMAL LOW (ref 3.5–5.1)
Sodium: 139 mEq/L (ref 135–145)
Total Bilirubin: 0.3 mg/dL (ref 0.3–1.2)
Total Protein: 6.9 g/dL (ref 6.0–8.3)

## 2013-09-22 LAB — SALICYLATE LEVEL: Salicylate Lvl: <2 mg/dL — ABNORMAL LOW (ref 2.8–20.0)

## 2013-09-22 MED ORDER — LORAZEPAM 1 MG PO TABS: 1.0000 mg | ORAL_TABLET | Freq: Three times a day (TID) | ORAL | Status: DC | PRN

## 2013-09-22 MED ORDER — CEPHALEXIN 500 MG PO CAPS
500.0000 mg | ORAL_CAPSULE | Freq: Two times a day (BID) | ORAL | Status: DC
  Administered 2013-09-22: 500 mg via ORAL
  Filled 2013-09-22: qty 1

## 2013-09-22 MED ORDER — ONDANSETRON HCL 4 MG PO TABS: 4.0000 mg | ORAL_TABLET | Freq: Three times a day (TID) | ORAL | Status: DC | PRN

## 2013-09-22 MED ORDER — ZOLPIDEM TARTRATE 5 MG PO TABS: 5.0000 mg | ORAL_TABLET | Freq: Every evening | ORAL | Status: DC | PRN

## 2013-09-22 MED ORDER — NICOTINE 21 MG/24HR TD PT24: 21.0000 mg | MEDICATED_PATCH | Freq: Every day | TRANSDERMAL | Status: DC

## 2013-09-22 MED ORDER — ALUM & MAG HYDROXIDE-SIMETH 200-200-20 MG/5ML PO SUSP: 30.0000 mL | ORAL | Status: DC | PRN

## 2013-09-22 MED ORDER — IBUPROFEN 200 MG PO TABS: 600.0000 mg | ORAL_TABLET | Freq: Three times a day (TID) | ORAL | Status: DC | PRN

## 2013-09-22 NOTE — Progress Notes (Signed)
[facility] did not get to see patient but will be sending information about the [facility] Orange Card program, using the address provided.  °

## 2013-09-22 NOTE — Progress Notes (Signed)
Patient Discharge Instructions:  After Visit Summary (AVS):   Faxed to:  09/22/13 Discharge Summary Note:   Faxed to:  09/22/13 Psychiatric Admission Assessment Note:   Faxed to:  09/22/13 Suicide Risk Assessment - Discharge Assessment:   Faxed to:  09/22/13 Faxed/Sent to the Next [facility] Care provider:  09/22/13 Faxed to [facility] @ [phone]  Jerelene Redden, 09/22/2013, 4:09 PM

## 2013-09-22 NOTE — Progress Notes (Signed)
Patient ID: [name], male   DOB: 1991-09-03, 22 y.o.   MRN: [id number] Patient Identification:  [name] Date of Evaluation:  09/22/2013   History of Present Illness: Patient was not in the mood to speak to this writer and Dr Tawni Carnes.  He wanted to be discharged home and stated he did not need our help.  Patient according to previous note took over dose of cough medications because his girl friend called him "JUNKEE".  Patient was discharged from our ER on Saturday and came back on Sunday  after the OD.  Patient was recently discharged from our inpatient [facility] after 2 days stay for detox from opiates.  Patient seems to be malingering and is not committed to completing detox treatment.  Patient states he is interested in receiving rehab treatment at [facility] or [facility].  He was discharged by the EDP to follow up with [facility].  Past Psychiatric History: Polysubstance dependence, Major depressive d/o, recurrent, SIMD   Past Medical History:    History reviewed. No pertinent past medical history.    History reviewed. No pertinent past surgical history.  Allergies: No Known Allergies  Current Medications:  Prior to Admission medications   Medication Sig Start Date End Date Taking? Authorizing Provider  traZODone (DESYREL) 50 MG tablet Take 1 tablet (50 mg total) by mouth at bedtime. 09/18/13   Fransisca Kaufmann, NP    Social History:    reports that he has been smoking Cigarettes.  He has a 11 pack-year smoking history. He does not have any smokeless tobacco history on file. He reports that he drinks about 14.4 ounces of alcohol per week. He reports that he uses illicit drugs (Marijuana).   Family History:    History reviewed. No pertinent family history.  Mental Status Examination/Evaluation:Psychiatric Specialty Exam: Physical Exam  ROS  Blood pressure 129/82, pulse 61, temperature 97.8 F (36.6 C), temperature source Oral, resp. rate 16, SpO2 98.00%.There is no weight on file to  calculate BMI.  General Appearance: Casual  Eye Contact::  Poor  Speech:  Clear and Coherent and Normal Rate  Volume:  Normal  Mood:  Angry, Depressed and Irritable  Affect:  Congruent and Depressed  Thought Process:  Coherent and Intact  Orientation:  Full (Time, Place, and Person)  Thought Content:  NA  Suicidal Thoughts:  No  Homicidal Thoughts:  No  Memory:  Immediate;   Good Recent;   Good Remote;   Good  Judgement:  Poor  Insight:  Lacking and Shallow  Psychomotor Activity:  Normal  Concentration:  Poor  Recall:  NA  Akathisia:  NA  Handed:  Right  AIMS (if indicated):     Assets:  Desire for Improvement  Sleep:          DIAGNOSIS:   AXIS I   Polysubstance abuse/dependence ;SIMD;Hx of Depression with suicidal ideation;Hx dx of Bipolar DO;?Malingering   AXIS II  Deffered  AXIS III See medical notes.  AXIS IV occupational problems, other psychosocial or environmental problems, problems related to social environment and problems with primary support group  AXIS V 61-70 mild symptoms     Assessment/Plan:  Face to face interview with Dr Tawni Carnes Patient was found fit for discharge home EDP discharged patient.    Pt was interviewed with FNP. Agree with above assessment and plan.  Ancil Linsey, MD

## 2013-09-22 NOTE — ED Provider Notes (Signed)
Medical screening examination/treatment/procedure(s) were performed by non-physician practitioner and as supervising physician I was immediately available for consultation/collaboration.  EKG Interpretation     Ventricular Rate:  65 PR Interval:  148 QRS Duration: 86 QT Interval:  406 QTC Calculation: 422 R Axis:   71 Text Interpretation:  Sinus rhythm ST elev, probable normal early repol pattern When compared to previous tracing rate is slower              Hanley Seamen, MD 09/22/13 [phone]

## 2013-09-22 NOTE — ED Provider Notes (Signed)
Care assumed at the change of shift. Pt asking to be discharged. States he did not overdose in a suicide attempt but was just 'trying to get high'. Denies any suicidal ideation. Awake alert and able to make decisions. He had been assessed by [facility] during the night and recommended placement but at this time does not appear to be a danger to self or others. He is not willing to stay in the ED for re-assessment by Psychiatry team or to have recommendations for community resources  [place] B. Bernette Mayers, MD 09/22/13 1159

## 2013-09-22 NOTE — ED Notes (Signed)
Pt stated that he was wanting to leave. EDP made aware. Pt given back belongings.
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
## 2013-09-22 NOTE — Progress Notes (Signed)
Poison control called to check patient status and close out patient case.

## 2013-09-22 NOTE — ED Notes (Addendum)
Pt belongings, 2 bags, located at nurses station in front of rms 11/12.

## 2013-09-22 NOTE — ED Provider Notes (Signed)
CSN: [id number]     Arrival date & time 09/21/13  2306 History   First MD Initiated Contact with Patient 09/22/13 0006     Chief Complaint  Patient presents with  . Drug Overdose   (Consider location/radiation/quality/duration/timing/severity/associated sxs/prior Treatment) HPI  [name] is a 22 y.o. male  with a hx of polysubstance abuse, depression, suicidal ideations, suicide attempt presents to the Emergency Department complaining of suicide attempt earlier today. Patient reports that he took 2 boxes of "cold medicine".  The cold medicine and contains Dextromethorphan HBr 30 mg; Chlorpheniramine 4 mg.  He reports he did this intentionally as an attempt to kill himself. Patient was discharged yesterday after being evaluated by psych and orthopedics for his broken right ankle. Patient was discharged home with Keflex but refused to fill prescription.  Patient reports that he feels depressed because both his girlfriend left him and his no longer allowed to see his children. He is currently here voluntarily, but has expressed suicidal ideations. He denies homicidal ideations auditory or visual hallucinations. He reports that he has been doing acid, mushrooms and any other hallucinogenic he can try.  History reviewed. No pertinent past medical history. History reviewed. No pertinent past surgical history. History reviewed. No pertinent family history. History  Substance Use Topics  . Smoking status: Current Every Day Smoker -- 1.00 packs/day for 11 years    Types: Cigarettes  . Smokeless tobacco: Not on file  . Alcohol Use: 14.4 oz/week    24 Cans of beer per week     Comment: case daily    Review of Systems  Constitutional: Negative for fever, diaphoresis, appetite change, fatigue and unexpected weight change.  HENT: Negative for mouth sores.   Eyes: Negative for visual disturbance.  Respiratory: Negative for cough, chest tightness, shortness of breath and wheezing.    Cardiovascular: Negative for chest pain.  Gastrointestinal: Negative for nausea, vomiting, abdominal pain, diarrhea and constipation.  Endocrine: Negative for polydipsia, polyphagia and polyuria.  Genitourinary: Negative for dysuria, urgency, frequency and hematuria.  Musculoskeletal: Negative for back pain and neck stiffness.  Skin: Negative for rash.  Allergic/Immunologic: Negative for immunocompromised state.  Neurological: Negative for syncope, light-headedness and headaches.  Hematological: Does not bruise/bleed easily.  Psychiatric/Behavioral: Positive for suicidal ideas and self-injury. Negative for sleep disturbance. The patient is not nervous/anxious.     Allergies  Review of patient's allergies indicates no known allergies.  Home Medications   Current Outpatient Rx  Name  Route  Sig  Dispense  Refill  . traZODone (DESYREL) 50 MG tablet   Oral   Take 1 tablet (50 mg total) by mouth at bedtime.   30 tablet   0    BP 132/76  Pulse 74  Temp(Src) 98.8 F (37.1 C) (Oral)  Resp 15  SpO2 96% Physical Exam  Nursing note and vitals reviewed. Constitutional: He is oriented to person, place, and time. He appears well-developed and well-nourished. No distress.  Awake, alert, nontoxic appearance  HENT:  Head: Normocephalic and atraumatic.  Mouth/Throat: Oropharynx is clear and moist. No oropharyngeal exudate.  Eyes: EOM are normal. Pupils are equal, round, and reactive to light. Right conjunctiva is injected. Left conjunctiva is injected. No scleral icterus.  Neck: Normal range of motion. Neck supple.  Cardiovascular: Normal rate, regular rhythm, normal heart sounds and intact distal pulses.   No murmur heard. Capillary refill = 3 sec  Pulmonary/Chest: Effort normal and breath sounds normal. No respiratory distress. He has no wheezes.  Abdominal: Soft. Bowel sounds are normal. He exhibits no distension. There is no tenderness. There is no rebound.  Musculoskeletal: Normal  range of motion. He exhibits no edema.  Neurological: He is alert and oriented to person, place, and time. He exhibits normal muscle tone. Coordination normal.  Speech is clear and goal oriented Moves extremities without ataxia  Skin: Skin is warm and dry. No rash noted. He is not diaphoretic. No erythema.  Toes are ecchymotic but warm to touch Cast in place to the right lower extremity no streaking above the cast  Psychiatric: He has a normal mood and affect.    ED Course  Procedures (including critical care time) Labs Review Labs Reviewed  GLUCOSE, CAPILLARY - Abnormal; Notable for the following:    Glucose-Capillary 102 (*)    All other components within normal limits  CBC  COMPREHENSIVE METABOLIC PANEL  ETHANOL  ACETAMINOPHEN LEVEL  SALICYLATE LEVEL  URINE RAPID DRUG SCREEN (HOSP PERFORMED)   Imaging Review Dg Ankle Complete Right  09/20/2013   CLINICAL DATA:  History of ankle fracture, with pain  EXAM: RIGHT ANKLE - COMPLETE 3+ VIEW  COMPARISON:  Prior radiograph from 09/16/2013  FINDINGS: Fiberglass splint has been removed. Comminuted bimalleolar fracture involving the right ankle is stable in position and alignment as compared to the prior exam. Slight widening of the medial tibiotalar joint space is unchanged. Soft tissue swelling has increased as compared to the prior exam. No new fracture identified. Joint effusion is noted.  IMPRESSION: Stable position and alignment of comminuted bimalleolar fracture with persistent asymmetric widening of the medial tibiotalar joint space. Soft tissue swelling has increased as compared to the prior examination. No new fracture.   Electronically Signed   By: [name] M.D.   On: 09/20/2013 05:18    EKG Interpretation     Ventricular Rate:  65 PR Interval:  148 QRS Duration: 86 QT Interval:  406 QTC Calculation: 422 R Axis:   71 Text Interpretation:  Sinus rhythm ST elev, probable normal early repol pattern When compared to  previous tracing rate is slower            MDM   1. Suicidal ideation   2. Polysubstance dependence      [name] presents after taking "2 boxes of cold medicine" containing Dextromethorphan HBr 30 mg; Chlorpheniramine 4 mg.  patient admits this is a suicide attempt. Record review shows that he was discharged yesterday after psychiatric evaluation. We'll reconsult psychiatric services.  Patient was discussed with poison control who recommends observation for 4-6 hours, IV fluids and Ativan and he becomes very tachycardic. They recommend to expect lethargy and possible tachycardia. Patient is not tachycardic at this time and is hemodynamically stable.  Labs pending.  Pt discussed with [place], PA-C who will follow until he can be moved to [facility].      Dahlia Client Rayce Brahmbhatt, PA-C 09/22/13 0112
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
## 2013-09-22 NOTE — ED Notes (Signed)
Poison control was called.  Poison Control recommended that pt be observed for 4-6 hours post ingestion.  Poison Control recommended to draw and acetaminophen level,basic lab work to include CBC, BMP; perform an EKG.  Poison Control states to be aware of drowsiness and tachycardia.
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
## 2013-09-22 NOTE — [facility] Counselor (Signed)
Raelyn Ensign RN  from [facility] states that pt isn't appropriate for this setting and pt needs a higher level of care than [facility] can provide.  Evette Cristal, [place] Assessment Counselor

## 2013-09-22 NOTE — BH Assessment (Signed)
Assessment Note  [place] [name] is an 22 y.o. male. Patient presents suicidal s/p overdose of approximately 40 tabs of OTC cough medicine. Patient states that he doesn't care about life. His girlfriend broke up with him on 08/16/13 and has not been able to see 2 of his 3 children for the past 2 years. States that he just wants to die. Unable to contract for safety and in need of inpatient hospitalization.  Axis I: Major Depression, Recurrent severe and Substance Abuse Axis II: Deferred Axis III: History reviewed. No pertinent past medical history. Axis IV: other psychosocial or environmental problems, problems related to social environment and problems with primary support group Axis V: 35  Past Medical History: History reviewed. No pertinent past medical history.  History reviewed. No pertinent past surgical history.  Family History: History reviewed. No pertinent family history.  Social History:  reports that he has been smoking Cigarettes.  He has a 11 pack-year smoking history. He does not have any smokeless tobacco history on file. He reports that he drinks about 14.4 ounces of alcohol per week. He reports that he uses illicit drugs (Marijuana).  Additional Social History:  Alcohol / Drug Use History of alcohol / drug use?: Yes Substance #1 Name of Substance 1: LSD 1 - Age of First Use: 22 yrs old  1 - Amount (size/oz): "excessive amounts"; "I use until I pass out or become so high I don't know what's going on" 1 - Frequency: daily for the past month (did not use 09/17/2013 -10/30/20014 as he was admitted to [facility]) 1 - Duration: on-going  1 - Last Use / Amount: 09/21/2013 Substance #2 Name of Substance 2: Alcohol  2 - Age of First Use: 22 yrs old  2 - Amount (size/oz): 12 pack of beer 2 - Frequency: daily for the past month (did not use 09/17/2013 -10/30/20014 as he was admitted to [facility]) 2 - Duration: on-going  2 - Last Use / Amount: 09/19/2013 Substance #3 Name of Substance 3:  THC 3 - Age of First Use: 22 yrs old  3 - Amount (size/oz): 5-6 blunts 3 - Frequency: daily  3 - Duration: daily for the past month (did not use 09/17/2013 -10/30/20014 as he was admitted to [facility]) 3 - Last Use / Amount: 09/19/2013  CIWA: CIWA-Ar BP: 138/80 mmHg Pulse Rate: 75 COWS:    Allergies: No Known Allergies  Home Medications:  (Not in a hospital admission)  OB/GYN Status:  No LMP for male patient.  General Assessment Data [facility] Is this a Tele or Face-to-Face Assessment?: Face-to-Face Is this an Initial Assessment or a Re-assessment for this encounter?: Initial Assessment Living Arrangements: Alone Can pt return to current living arrangement?: Yes Admission Status: Voluntary Is patient capable of signing voluntary admission?: Yes Transfer from: Home Referral Source: MD  Medical Screening Exam [facility] Walk-in ONLY) Medical Exam completed: Yes  [facility] Crisis Care Plan Living Arrangements: Alone Name of Psychiatrist: None Name of Therapist: None  Education Status Is patient currently in school?: No Current Grade: Na Highest grade of school patient has completed: 69 Name of school: [employer]  Risk to self Suicidal Ideation: Yes-Currently Present Suicidal Intent: Yes-Currently Present Is patient at risk for suicide?: Yes Suicidal Plan?: Yes-Currently Present Specify Current Suicidal Plan: Overdose Access to Means: Yes Specify Access to Suicidal Means: OTC pills What has been your use of drugs/alcohol within the last 12 months?: Daily Previous Attempts/Gestures: Yes How many times?:  (Several) Other Self  Harm Risks: None Triggers for Past Attempts: Family contact;Other personal contacts Intentional Self Injurious Behavior: None Family Suicide History: No Recent stressful life event(s): Loss (Comment) (Break up w/GF) Persecutory voices/beliefs?: No Depression: Yes Depression Symptoms: Feeling worthless/self pity;Feeling  angry/irritable;Loss of interest in usual pleasures Substance abuse history and/or treatment for substance abuse?: Yes Suicide prevention information given to non-admitted patients: Not applicable  Risk to Others Homicidal Ideation: No Thoughts of Harm to Others: No Current Homicidal Intent: No Current Homicidal Plan: No Access to Homicidal Means: No Identified Victim: None History of harm to others?: No Assessment of Violence: None Noted Violent Behavior Description:  (Na) Does patient have access to weapons?: No Criminal Charges Pending?: No Does patient have a court date: No  Psychosis Hallucinations: None noted Delusions: None noted  Mental Status Report Appear/Hygiene: Disheveled Eye Contact: Fair Motor Activity: Freedom of movement;Unremarkable Speech: Logical/coherent Level of Consciousness: Quiet/awake Mood: Depressed Affect: Appropriate to circumstance Anxiety Level: None Thought Processes: Coherent;Relevant Judgement: Impaired Orientation: Person;Place;Time;Situation Obsessive Compulsive Thoughts/Behaviors: None  Cognitive Functioning Concentration: Decreased Memory: Recent Intact;Remote Intact IQ: Average Insight: Poor Impulse Control: Poor Appetite: Good Weight Loss:  (None noted) Weight Gain:  (None noted) Sleep: Decreased Total Hours of Sleep:  (Varies) Vegetative Symptoms: None  ADLScreening [facility] Assessment Services) Patient's cognitive ability adequate to safely complete daily activities?: Yes Patient able to express need for assistance with ADLs?: Yes Independently performs ADLs?: Yes (appropriate for developmental age)  Prior Inpatient Therapy Prior Inpatient Therapy: Yes Prior Therapy Dates: 09/17/13 (09/17/13) Prior Therapy Facilty/Provider([facility]): [facility] Reason for Treatment: SA, SI  Prior Outpatient Therapy Prior Outpatient Therapy: Yes Prior Therapy Dates: 3 + years ago Prior Therapy Facilty/Provider(s): [facility] Reason for  Treatment: Depression  ADL Screening (condition at time of admission) Patient's cognitive ability adequate to safely complete daily activities?: Yes Is the patient deaf or have difficulty hearing?: No Does the patient have difficulty seeing, even when wearing glasses/contacts?: No Does the patient have difficulty concentrating, remembering, or making decisions?: No Patient able to express need for assistance with ADLs?: Yes Does the patient have difficulty dressing or bathing?: No Independently performs ADLs?: Yes (appropriate for developmental age) Communication: Independent Dressing (OT): Independent Grooming: Independent Feeding: Independent Bathing: Independent Toileting: Independent In/Out Bed: Independent Walks in Home: Independent Does the patient have difficulty walking or climbing stairs?: Yes (Has cast on left foot) Weakness of Legs: None Weakness of Arms/Hands: None       Abuse/Neglect Assessment (Assessment to be complete while patient is alone) Physical Abuse: Denies Verbal Abuse: Denies Sexual Abuse: Denies Exploitation of patient/patient's resources: Denies Self-Neglect: Denies Values / Beliefs Cultural Requests During Hospitalization: None Spiritual Requests During Hospitalization: None   Advance Directives (For Healthcare) Advance Directive: Patient does not have advance directive;Patient would not like information    Additional Information 1:1 In Past 12 Months?: No [name] Risk: No Elopement Risk: No Does patient have medical clearance?: Yes     Disposition:  Disposition Initial Assessment Completed for this Encounter: Yes Disposition of Patient: Other dispositions Type of inpatient treatment program: Adult Other disposition(s): Referred to outside facility  On Site Evaluation by:   Reviewed with Physician:    Rudi Coco 09/22/2013 4:55 AM

## 2013-09-22 NOTE — ED Notes (Signed)
Charge talked with [facility] AC, pt to be reassessed this morning by extender.

## 2013-09-22 NOTE — ED Notes (Signed)
Pt threw away pants in trash saying he did not want them anymore.

## 2013-09-24 ENCOUNTER — Emergency Department (HOSPITAL_COMMUNITY)
Admission: EM | Admit: 2013-09-24 | Discharge: 2013-09-25 | Disposition: A | Discharge status: Home / Self Care | Payer: Federal, State, Local not specified - Other | Attending: Emergency Medicine | Admitting: Emergency Medicine

## 2013-09-24 DIAGNOSIS — F1910 Other psychoactive substance abuse, uncomplicated: Secondary | ICD-10-CM | POA: Insufficient documentation

## 2013-09-24 DIAGNOSIS — F1994 Other psychoactive substance use, unspecified with psychoactive substance-induced mood disorder: Secondary | ICD-10-CM | POA: Insufficient documentation

## 2013-09-24 DIAGNOSIS — T50992A Poisoning by other drugs, medicaments and biological substances, intentional self-harm, initial encounter: Secondary | ICD-10-CM | POA: Insufficient documentation

## 2013-09-24 DIAGNOSIS — IMO0002 Reserved for concepts with insufficient information to code with codable children: Secondary | ICD-10-CM | POA: Insufficient documentation

## 2013-09-24 DIAGNOSIS — F39 Unspecified mood [affective] disorder: Secondary | ICD-10-CM | POA: Insufficient documentation

## 2013-09-24 DIAGNOSIS — F3289 Other specified depressive episodes: Secondary | ICD-10-CM | POA: Insufficient documentation

## 2013-09-24 DIAGNOSIS — F329 Major depressive disorder, single episode, unspecified: Secondary | ICD-10-CM | POA: Insufficient documentation

## 2013-09-24 DIAGNOSIS — T50991A Poisoning by other drugs, medicaments and biological substances, accidental (unintentional), initial encounter: Secondary | ICD-10-CM | POA: Insufficient documentation

## 2013-09-24 DIAGNOSIS — R000 Tachycardia, unspecified: Secondary | ICD-10-CM | POA: Insufficient documentation

## 2013-09-24 DIAGNOSIS — R4182 Altered mental status, unspecified: Secondary | ICD-10-CM | POA: Insufficient documentation

## 2013-09-24 DIAGNOSIS — R45851 Suicidal ideations: Secondary | ICD-10-CM | POA: Insufficient documentation

## 2013-09-24 DIAGNOSIS — F17200 Nicotine dependence, unspecified, uncomplicated: Secondary | ICD-10-CM | POA: Insufficient documentation

## 2013-09-24 HISTORY — DX: Mental disorder, not otherwise specified: F99

## 2013-09-24 LAB — RAPID URINE DRUG SCREEN, HOSP PERFORMED
Amphetamines: NOT DETECTED
Barbiturates: NOT DETECTED

## 2013-09-24 LAB — CBC
Hemoglobin: 13.9 g/dL (ref 13.0–17.0)
MCH: 30 pg (ref 26.0–34.0)
MCV: 90.7 fL (ref 78.0–100.0)
Platelets: 335 10*3/uL (ref 150–400)
RBC: 4.64 MIL/uL (ref 4.22–5.81)

## 2013-09-24 LAB — COMPREHENSIVE METABOLIC PANEL
ALT: 8 U/L (ref 0–53)
CO2: 23 mEq/L (ref 19–32)
Calcium: 9.3 mg/dL (ref 8.4–10.5)
Chloride: 105 mEq/L (ref 96–112)
GFR calc Af Amer: >90 mL/min (ref >90)
GFR calc non Af Amer: >90 mL/min (ref >90)
Glucose, Bld: 103 mg/dL — ABNORMAL HIGH (ref 70–99)
Sodium: 140 mEq/L (ref 135–145)
Total Bilirubin: 0.3 mg/dL (ref 0.3–1.2)
Total Protein: 7.3 g/dL (ref 6.0–8.3)

## 2013-09-24 LAB — ACETAMINOPHEN LEVEL: Acetaminophen (Tylenol), Serum: <15 ug/mL (ref 10–30)

## 2013-09-24 LAB — GLUCOSE, CAPILLARY: Glucose-Capillary: 88 mg/dL (ref 70–99)

## 2013-09-24 MED ORDER — LORAZEPAM 2 MG/ML IJ SOLN
1.0000 mg | Freq: Once | INTRAMUSCULAR | Status: AC
  Administered 2013-09-24: 1 mg via INTRAVENOUS
  Filled 2013-09-24: qty 1

## 2013-09-24 NOTE — ED Notes (Signed)
Notified RN, Abby, pt. CBG 88.

## 2013-09-24 NOTE — ED Notes (Signed)
Patient states he wants to die and took 40-50 "cough medicine pills". Patient states his heart is "racing". Denies pain. Unable to advise when he took the pills

## 2013-09-24 NOTE — ED Provider Notes (Signed)
CSN: [id number]     Arrival date & time 09/24/13  1828 History   First MD Initiated Contact with Patient 09/24/13 1855     Chief Complaint  Patient presents with  . Suicidal  . Ingestion   (Consider location/radiation/quality/duration/timing/severity/associated sxs/prior Treatment) HPI Comments: Patient presents to the ER after overdose. Patient reports taking 40-50 cough medicine pills earlier today. He could not state when he took the medications. He did tell the nurse that he took LSD as well. He did report to EMS that he wanted to die. Upon arrival to the ER, however, he is extremely agitated stating that he is worried that he is going to die and does not want to do. Patient is complaining of racing heartbeat. Patient is having difficulty answering simple questions, cannot provide any further information at this time. Level V Caveat due to mental status change.  Patient is a 22 y.o. male presenting with Ingested Medication.  Ingestion    No past medical history on file. No past surgical history on file. No family history on file. History  Substance Use Topics  . Smoking status: Current Every Day Smoker -- 1.00 packs/day for 11 years    Types: Cigarettes  . Smokeless tobacco: Not on file  . Alcohol Use: 14.4 oz/week    24 Cans of beer per week     Comment: case daily    Review of Systems  Cardiovascular: Positive for palpitations.  Psychiatric/Behavioral: Positive for suicidal ideas, dysphoric mood and agitation.  All other systems reviewed and are negative.    Allergies  Review of patient's allergies indicates no known allergies.  Home Medications   Current Outpatient Rx  Name  Route  Sig  Dispense  Refill  . Dextromethorphan HBr 15 MG CAPS   Oral   Take 30 mg by mouth as needed (cough).          BP 148/88  Pulse 128  Resp 20  SpO2 99% Physical Exam  Constitutional: He is oriented to person, place, and time. He appears well-developed and well-nourished. No  distress.  HENT:  Head: Normocephalic and atraumatic.  Right Ear: Hearing normal.  Left Ear: Hearing normal.  Nose: Nose normal.  Mouth/Throat: Oropharynx is clear and moist and mucous membranes are normal.  Eyes: Conjunctivae and EOM are normal. Pupils are equal, round, and reactive to light.  Pupils dilated, but reactive  Neck: Normal range of motion. Neck supple.  Cardiovascular: Regular rhythm, S1 normal and S2 normal.  Tachycardia present.  Exam reveals no gallop and no friction rub.   No murmur heard. Pulmonary/Chest: Effort normal and breath sounds normal. No respiratory distress. He exhibits no tenderness.  Abdominal: Soft. Normal appearance and bowel sounds are normal. There is no hepatosplenomegaly. There is no tenderness. There is no rebound, no guarding, no tenderness at McBurney's point and negative Murphy's sign. No hernia.  Musculoskeletal: Normal range of motion.  Neurological: He is alert and oriented to person, place, and time. He has normal strength. No cranial nerve deficit or sensory deficit. Coordination normal. GCS eye subscore is 4. GCS verbal subscore is 5. GCS motor subscore is 6.  Skin: Skin is warm, dry and intact. No rash noted. No cyanosis.  Psychiatric: He has a normal mood and affect. His speech is normal and behavior is normal. Thought content normal.    ED Course  Procedures (including critical care time) Labs Review Labs Reviewed  COMPREHENSIVE METABOLIC PANEL - Abnormal; Notable for the following:    Potassium  3.4 (*)    Glucose, Bld 103 (*)    All other components within normal limits  SALICYLATE LEVEL - Abnormal; Notable for the following:    Salicylate Lvl <2.0 (*)    All other components within normal limits  CBC  ETHANOL  ACETAMINOPHEN LEVEL  URINE RAPID DRUG SCREEN (HOSP PERFORMED)  GLUCOSE, CAPILLARY   Imaging Review No results found.  EKG Interpretation   None       MDM  Diagnosis: 1. Acute overdose 2. Depression  Patient  presents to the ER for evaluation after apparent overdose. Patient reportedly made comments to EMS about wanting to die. Patient does have a recent suicide attempt by running out into traffic, suffering a broken leg. At arrival to the ER, patient is extremely agitated, secondary to his ingestion. He was administered Ativan and has improved, now sleeping. The remainder of his workup is unremarkable. He will need to be monitored for a period of time before he can be medically cleared for psychiatric evaluation.    Gilda Crease, MD 09/24/13 [phone]

## 2013-09-25 ENCOUNTER — Encounter (HOSPITAL_COMMUNITY): Payer: Self-pay | Admitting: *Deleted

## 2013-09-25 DIAGNOSIS — F1910 Other psychoactive substance abuse, uncomplicated: Secondary | ICD-10-CM

## 2013-09-25 DIAGNOSIS — F1994 Other psychoactive substance use, unspecified with psychoactive substance-induced mood disorder: Secondary | ICD-10-CM

## 2013-09-25 DIAGNOSIS — R45851 Suicidal ideations: Secondary | ICD-10-CM

## 2013-09-25 MED ORDER — IBUPROFEN 800 MG PO TABS: 800.0000 mg | ORAL_TABLET | Freq: Once | ORAL | Status: DC

## 2013-09-25 MED ORDER — HYDROCODONE-ACETAMINOPHEN 5-325 MG PO TABS
1.0000 | ORAL_TABLET | Freq: Once | ORAL | Status: AC
  Administered 2013-09-25: 1 via ORAL
  Filled 2013-09-25: qty 1

## 2013-09-25 NOTE — BH Assessment (Signed)
Assessment Note  [name] is a 22 y.o. male who presents to [facility] after an overdose.  Pt says he wants to die.  During the interview, the pt is angry/irritable and refusing to answer most questions, some collateral information obtained to complete assessment.  Pt ingested 40-50 cough medicine pills, would not disclose specific medications ingested.  Pt admitted to this writer that he used LSD and drank 6-120oz beer.  Upon arrival to the emerg dept, pt was agitated, verbalizing to the medical personnel that he worried that he was going to die and had no desire to end his life.  Pt has minimal slurred speech and had no eye contact during interview.  Pt says he's attempted to harm self 4-5x's in there past, all by overdose.  Pt refused to disclose precip events that lead to SI attempt.  Pt declined by Donell Sievert, PA.  Pt requires higher level of care, TTS will seek other appropriate placements.    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 Mental disorder    Axis IV: other psychosocial or environmental problems, problems related to social environment and problems with primary support group Axis V: 31-40 impairment in reality testing  Past Medical History:  Past Medical History  Diagnosis Date  . Mental disorder     No past surgical history on file.  Family History: No family history on file.  Social History:  reports that he has been smoking Cigarettes.  He has a 11 pack-year smoking history. He does not have any smokeless tobacco history on file. He reports that he drinks about 14.4 ounces of alcohol per week. He reports that he uses illicit drugs (Marijuana and LSD).  Additional Social History:  Alcohol / Drug Use Pain Medications: See MAR  Prescriptions: See MAR  Over the Counter: See MSR  History of alcohol / drug use?: Yes Longest period of sobriety (when/how long): None--Pt refused  to disclose information to this writer   CIWA: CIWA-Ar BP: 116/72 mmHg Pulse Rate: 92 COWS:    Allergies: No Known Allergies  Home Medications:  (Not in a hospital admission)  OB/GYN Status:  No LMP for male patient.  General Assessment Data [facility] Is this a Tele or Face-to-Face Assessment?: Tele Assessment Is this an Initial Assessment or a Re-assessment for this encounter?: Initial Assessment Living Arrangements: Alone Can pt return to current living arrangement?: Yes Admission Status: Involuntary Is patient capable of signing voluntary admission?: No Transfer from: Acute Hospital Referral Source: MD  Medical Screening Exam [facility] Walk-in ONLY) Medical Exam completed: No Reason for MSE not completed: Other: (None )  [facility] Crisis Care Plan Living Arrangements: Alone Name of Psychiatrist: Pt refused to disclose information  Name of Therapist: Pt refused to disclose information   Education Status Is patient currently in school?: No Current Grade: None  Highest grade of school patient has completed: 34 Name of school: None  Contact person: None   Risk to self Suicidal Ideation: Yes-Currently Present Suicidal Intent: Yes-Currently Present Is patient at risk for suicide?: Yes Suicidal Plan?: Yes-Currently Present Specify Current Suicidal Plan: overdose on cold medication  Access to Means: Yes Specify Access to Suicidal Means: cold medications, drugs, alcohol, pills  What has been your use of drugs/alcohol within the last 12 months?: Abusing: alcohol, various drugs, LSD Previous Attempts/Gestures: Yes How many times?: 5 Other Self Harm Risks: None  Triggers for Past Attempts: Unpredictable Intentional Self Injurious Behavior: None Family Suicide History: No Recent stressful life event(s): Other (Comment) (Refused to answer questions ) Persecutory voices/beliefs?: No Depression: Yes Depression Symptoms: Feeling angry/irritable;Feeling  worthless/self pity;Loss of interest in usual pleasures;Isolating Substance abuse history and/or treatment for substance abuse?: Yes Suicide prevention information given to non-admitted patients: Not applicable  Risk to Others Homicidal Ideation: No Thoughts of Harm to Others: No Current Homicidal Intent: No Current Homicidal Plan: No Access to Homicidal Means: No Identified Victim: None  History of harm to others?: No Assessment of Violence: None Noted Violent Behavior Description: None  Does patient have access to weapons?: No Criminal Charges Pending?: No Does patient have a court date: No  Psychosis Hallucinations: None noted Delusions: None noted  Mental Status Report Appear/Hygiene: Disheveled Eye Contact: Poor Motor Activity: Other (Comment) (Pt has broken right ankle; using crutches) Speech: Logical/coherent;Aggressive Level of Consciousness: Alert Mood: Angry;Irritable Affect: Angry;Irritable Anxiety Level: None Thought Processes: Coherent;Relevant Judgement: Impaired Orientation: Person;Place;Time;Situation Obsessive Compulsive Thoughts/Behaviors: None  Cognitive Functioning Concentration: Normal Memory: Recent Intact;Remote Intact IQ: Average Insight: Poor Impulse Control: Poor Appetite: Fair Weight Loss: 0 Weight Gain: 0 Sleep: No Change Total Hours of Sleep:  (Refused to answer question ) Vegetative Symptoms: None  ADLScreening [facility] Assessment Services) Patient's cognitive ability adequate to safely complete daily activities?: Yes Patient able to express need for assistance with ADLs?: Yes Independently performs ADLs?: Yes (appropriate for developmental age)  Prior Inpatient Therapy Prior Inpatient Therapy: Yes Prior Therapy Dates: 2009,2010,2012,2014 Prior Therapy Facilty/Provider(s): [facility], [facility], [facility],  Reason for Treatment: SI/SA/Depression   Prior Outpatient Therapy Prior Outpatient Therapy: No Prior Therapy Dates: None  Prior  Therapy Facilty/Provider(s): None  Reason for Treatment: None   ADL Screening (condition at time of admission) Patient's cognitive ability adequate to safely complete daily activities?: Yes Is the patient deaf or have difficulty hearing?: No Does the patient have difficulty seeing, even when wearing glasses/contacts?: No Does the patient have difficulty concentrating, remembering, or making decisions?: No Patient able to express need for assistance with ADLs?: Yes Does the patient have difficulty dressing or bathing?: No Independently performs ADLs?: Yes (appropriate for developmental age) Does the patient have difficulty walking or climbing stairs?: No Weakness of Legs: Right (Broken right ankle ) Weakness of Arms/Hands: None  Home Assistive Devices/Equipment Home Assistive Devices/Equipment: Crutches  Therapy Consults (therapy consults require a physician order) PT Evaluation Needed: No OT Evalulation Needed: No SLP Evaluation Needed: No Abuse/Neglect Assessment (Assessment to be complete while patient is alone) Physical Abuse: Denies Verbal Abuse: Denies Sexual Abuse: Denies Exploitation of patient/patient's resources: Denies Self-Neglect: Denies Values / Beliefs Cultural Requests During Hospitalization: None Spiritual Requests During Hospitalization: None Consults Spiritual Care Consult Needed: No Social Work Consult Needed: No Merchant navy officer (For Healthcare) Advance Directive: Patient does not have advance directive;Patient would not like information Pre-existing out of facility DNR order (yellow form or pink MOST form): No Nutrition Screen- [facility] Adult/[facility] Patient's home diet: Regular  Additional Information 1:1 In Past 12 Months?: No [name] Risk: No Elopement Risk: No Does patient have medical clearance?: Yes     Disposition:  Disposition Initial Assessment Completed for this Encounter: Yes Disposition of Patient: Inpatient treatment program (Declined by  Donell Sievert, PA--TTS to seek other placement ) Type of inpatient treatment program: Adult Other disposition(s): Referred to outside facility (Declined by Donell Sievert, PA--TTS to seek other placement )  On Site Evaluation by:   Reviewed with Physician:  Murrell Redden 09/25/2013 6:29 AM

## 2013-09-25 NOTE — Progress Notes (Addendum)
CSW consulted by NP Rankin concerning pts discharge home.  Pt is not ambulatory due having a cast and crutches.  Pt has no transportation and lives in [place]. SW contacted CS Asst Director Shon Baton for approval of a [employer]. CSW contacted [employer] to coordinate transportation for pt.    [name], [name]  [phone] .09/25/2013

## 2013-09-25 NOTE — ED Notes (Signed)
Pt states that he lied about not being suicidal when he spoke to psychiatry. States he lied in order to leave and get "high." Psychiatry informed.

## 2013-09-25 NOTE — BH Assessment (Signed)
[facility] Assessment Progress Note  Per Shuvon Rankin, FNP, pt is safe to discharge, needing only referrals to substance abuse programs.  This Clinical research associate drafted a referral list, including [facility], [facility], [facility], and [facility] for residential treatment, and [facility] (both in [place] and in [place]) for outpatient treatment.  This list was given to the pt.  EDP Bethann Berkshire, MD signed Notice of Commitment Change, releasing the pt from petition.  Pt's nurse, Fleet Contras, was notified, as well as Dentist, the Child psychotherapist, who is obtaining a taxi voucher for the pt.  Doylene Canning, MA Triage Specialist 09/25/13 @ 16:48

## 2013-09-25 NOTE — ED Notes (Addendum)
Pt belongings in locker 30, two black jackets, one shirt, one black pair of sweat pants, a pair of sneakers, a hat, a black and blue book bag, a cell phone a phone charger, a charger stick, a zippo lighter, a wrist watch, a partial pack of cigarettes, various personal hygiene items (a sparse but eclectic mix), a drivers license, a Tree surgeon card, and a cereal bar

## 2013-09-25 NOTE — ED Provider Notes (Signed)
Pt fully awake and alert. Vitals normal. Psych team eval pending.     Suzi Roots, MD 09/25/13 [phone]

## 2013-09-25 NOTE — ED Notes (Signed)
Waiting for IVC paperwork to be rescinded, then d/c.

## 2013-09-25 NOTE — ED Notes (Signed)
Charge spoke to [facility], pt refused at [facility], will work on other placement.

## 2013-09-25 NOTE — [facility] Counselor (Signed)
Writer contacted Dr. Denton Lank for clinical information and disposition.  Per Dr. Denton Lank, pt will IVC'd due to threatening to leave.

## 2013-09-25 NOTE — Consult Note (Signed)
[facility] Face-to-Face Psychiatry Consult   Reason for Consult:  Drug over dose Referring Physician:  EDP  [name] is an 22 y.o. male.  Assessment: AXIS I:  Polysubstance abuse AXIS II:  Deferred AXIS III:   Past Medical History  Diagnosis Date  . Mental disorder    AXIS IV:  other psychosocial or environmental problems and problems related to social environment AXIS V:  51-60 moderate symptoms  Plan:  No evidence of imminent risk to self or others at present.   Patient does not meet criteria for psychiatric inpatient admission. Supportive therapy provided about ongoing stressors. Discussed crisis plan, support from social network, calling 911, coming to the Emergency Department, and calling Suicide Hotline.  Subjective:   [name] is a 22 y.o. male.  HPI:  Patient states that he was not trying to kill him self he was just getting high.  "I was just getting high.  I usually take about 15 or 16  Coricidin but it stop working as much so I just took a little more about 18 or 20 so that I could get high; boy I was on a bad trip last night.  I brought my self here I thought you guys was trying to harvest my organs when I got here; I thought ya'll was sucking my fluid out.  I feel better now.  No I am not suicidal, homicidal and I ain't hearing no voices.  I'm just ready to go home I am suppose to be in [facility].  I can contract for safety if I need to.  I don't want to kill my self or nobody.    Revisit patient after telling nurse that he was suicidal.  Patient states "I don't know what I want; all I know is that I don't care.  I am not hopeless or worthless; my life just sucks.  I ain't gonna take no Zyprexa or Seroquel I ain't psychotic; I don't care what they've said."    A 3rd visit with patient after he had calm down.  Patient states that he is not suicidal.  "I'm not suicidal I just want some help.  I need rehab." Patient states that he want to leave from hospital to go to  rehab.  Explained to patient that we would check all surrounding areas for bed openings in rehab facilities but there was no guarantee that there would be any beds.  Also informed patient that he could continue to call the facilities from home to be accepted to the rehab facilities. Patient in agreement to search for open beds and if none found he will continue to contact from home.   Past Psychiatric History: Past Medical History  Diagnosis Date  . Mental disorder     reports that he has been smoking Cigarettes.  He has a 11 pack-year smoking history. He does not have any smokeless tobacco history on file. He reports that he drinks about 14.4 ounces of alcohol per week. He reports that he uses illicit drugs (Marijuana and LSD). History reviewed. No pertinent family history. Family History Substance Abuse: No Family Supports: No (Refused to answer question) Living Arrangements: Alone Can pt return to current living arrangement?: Yes Abuse/Neglect [facility]) Physical Abuse: Denies Verbal Abuse: Denies Sexual Abuse: Denies Allergies:  No Known Allergies  ACT Assessment Complete:  Yes:    Educational Status    Risk to Self: Risk to self Suicidal Ideation: Yes-Currently Present Suicidal Intent: Yes-Currently Present Is patient at risk  for suicide?: Yes Suicidal Plan?: Yes-Currently Present Specify Current Suicidal Plan: overdose on cold medication  Access to Means: Yes Specify Access to Suicidal Means: cold medications, drugs, alcohol, pills  What has been your use of drugs/alcohol within the last 12 months?: Abusing: alcohol, various drugs, LSD Previous Attempts/Gestures: Yes How many times?: 5 Other Self Harm Risks: None  Triggers for Past Attempts: Unpredictable Intentional Self Injurious Behavior: None Family Suicide History: No Recent stressful life event(s): Other (Comment) (Refused to answer questions ) Persecutory voices/beliefs?: No Depression: Yes Depression Symptoms:  Feeling angry/irritable;Feeling worthless/self pity;Loss of interest in usual pleasures;Isolating Substance abuse history and/or treatment for substance abuse?: Yes Suicide prevention information given to non-admitted patients: Not applicable  Risk to Others: Risk to Others Homicidal Ideation: No Thoughts of Harm to Others: No Current Homicidal Intent: No Current Homicidal Plan: No Access to Homicidal Means: No Identified Victim: None  History of harm to others?: No Assessment of Violence: None Noted Violent Behavior Description: None  Does patient have access to weapons?: No Criminal Charges Pending?: No Does patient have a court date: No  Abuse: Abuse/Neglect Assessment (Assessment to be complete while patient is alone) Physical Abuse: Denies Verbal Abuse: Denies Sexual Abuse: Denies Exploitation of patient/patient's resources: Denies Self-Neglect: Denies  Prior Inpatient Therapy: Prior Inpatient Therapy Prior Inpatient Therapy: Yes Prior Therapy Dates: 2009,2010,2012,2014 Prior Therapy Facilty/Provider(s): [facility], [facility], [facility],  Reason for Treatment: SI/SA/Depression   Prior Outpatient Therapy: Prior Outpatient Therapy Prior Outpatient Therapy: No Prior Therapy Dates: None  Prior Therapy Facilty/Provider(s): None  Reason for Treatment: None   Additional Information: Additional Information 1:1 In Past 12 Months?: No [name] Risk: No Elopement Risk: No Does patient have medical clearance?: Yes                  Objective: Blood pressure 105/59, pulse 70, temperature 0 F (-17.8 C), temperature source Oral, resp. rate 20, SpO2 97.00%.There is no weight on file to calculate BMI. Results for orders placed during the hospital encounter of 09/24/13 (from the past 72 hour(s))  CBC     Status: None   Collection Time    09/24/13  6:59 PM      Result Value Range   WBC 7.7  4.0 - 10.5 K/uL   RBC 4.64  4.22 - 5.81 MIL/uL   Hemoglobin 13.9  13.0 - 17.0 g/dL    HCT 09.8  11.9 - 14.7 %   MCV 90.7  78.0 - 100.0 fL   MCH 30.0  26.0 - 34.0 pg   MCHC 33.0  30.0 - 36.0 g/dL   RDW 82.9  56.2 - 13.0 %   Platelets 335  150 - 400 K/uL  COMPREHENSIVE METABOLIC PANEL     Status: Abnormal   Collection Time    09/24/13  6:59 PM      Result Value Range   Sodium 140  135 - 145 mEq/L   Potassium 3.4 (*) 3.5 - 5.1 mEq/L   Chloride 105  96 - 112 mEq/L   CO2 23  19 - 32 mEq/L   Glucose, Bld 103 (*) 70 - 99 mg/dL   BUN 11  6 - 23 mg/dL   Creatinine, Ser 8.65  0.50 - 1.35 mg/dL   Calcium 9.3  8.4 - 78.4 mg/dL   Total Protein 7.3  6.0 - 8.3 g/dL   Albumin 4.0  3.5 - 5.2 g/dL   AST 13  0 - 37 U/L   ALT 8  0 -  53 U/L   Alkaline Phosphatase 76  39 - 117 U/L   Total Bilirubin 0.3  0.3 - 1.2 mg/dL   GFR calc non Af Amer >90  >90 mL/min   GFR calc Af Amer >90  >90 mL/min   Comment: (NOTE)     The eGFR has been calculated using the CKD EPI equation.     This calculation has not been validated in all clinical situations.     eGFR's persistently <90 mL/min signify possible Chronic Kidney     Disease.  ETHANOL     Status: None   Collection Time    09/24/13  6:59 PM      Result Value Range   Alcohol, Ethyl (B) <11  0 - 11 mg/dL   Comment:            LOWEST DETECTABLE LIMIT FOR     SERUM ALCOHOL IS 11 mg/dL     FOR MEDICAL PURPOSES ONLY  ACETAMINOPHEN LEVEL     Status: None   Collection Time    09/24/13  6:59 PM      Result Value Range   Acetaminophen (Tylenol), Serum <15.0  10 - 30 ug/mL   Comment:            THERAPEUTIC CONCENTRATIONS VARY     SIGNIFICANTLY. A RANGE OF 10-30     ug/mL MAY BE AN EFFECTIVE     CONCENTRATION FOR MANY PATIENTS.     HOWEVER, SOME ARE BEST TREATED     AT CONCENTRATIONS OUTSIDE THIS     RANGE.     ACETAMINOPHEN CONCENTRATIONS     >150 ug/mL AT 4 HOURS AFTER     INGESTION AND >50 ug/mL AT 12     HOURS AFTER INGESTION ARE     OFTEN ASSOCIATED WITH TOXIC     REACTIONS.  SALICYLATE LEVEL     Status: Abnormal   Collection  Time    09/24/13  6:59 PM      Result Value Range   Salicylate Lvl <2.0 (*) 2.8 - 20.0 mg/dL  URINE RAPID DRUG SCREEN (HOSP PERFORMED)     Status: None   Collection Time    09/24/13  8:15 PM      Result Value Range   Opiates NONE DETECTED  NONE DETECTED   Cocaine NONE DETECTED  NONE DETECTED   Benzodiazepines NONE DETECTED  NONE DETECTED   Amphetamines NONE DETECTED  NONE DETECTED   Tetrahydrocannabinol NONE DETECTED  NONE DETECTED   Barbiturates NONE DETECTED  NONE DETECTED   Comment:            DRUG SCREEN FOR MEDICAL PURPOSES     ONLY.  IF CONFIRMATION IS NEEDED     FOR ANY PURPOSE, NOTIFY LAB     WITHIN 5 DAYS.                LOWEST DETECTABLE LIMITS     FOR URINE DRUG SCREEN     Drug Class       Cutoff (ng/mL)     Amphetamine      1000     Barbiturate      200     Benzodiazepine   200     Tricyclics       300     Opiates          300     Cocaine          300     THC  50  GLUCOSE, CAPILLARY     Status: None   Collection Time    09/24/13  8:18 PM      Result Value Range   Glucose-Capillary 88  70 - 99 mg/dL     No current facility-administered medications for this encounter.   Current Outpatient Prescriptions  Medication Sig Dispense Refill  . Dextromethorphan HBr 15 MG CAPS Take 30 mg by mouth as needed (cough).        Psychiatric Specialty Exam:     Blood pressure 105/59, pulse 70, temperature 0 F (-17.8 C), temperature source Oral, resp. rate 20, SpO2 97.00%.There is no weight on file to calculate BMI.  General Appearance: Casual  Eye Contact::  Good  Speech:  Clear and Coherent and Normal Rate  Volume:  Normal  Mood:  Angry and Irritable  Affect:  Blunt and Labile  Thought Process:  Circumstantial  Orientation:  Full (Time, Place, and Person)  Thought Content:  Rumination  Suicidal Thoughts:  No  Homicidal Thoughts:  No  Memory:  Immediate;   Good Recent;   Good  Judgement:  Impaired  Insight:  Lacking  Psychomotor Activity:  Normal   Concentration:  Fair  Recall:  Good  Akathisia:  No  Handed:  Right  AIMS (if indicated):     Assets:  Communication Skills  Sleep:      Face to face interview and consult with Dr. Ladona Ridgel  Treatment Plan Summary: Rehab resource  Disposition:  Search for bed openings at [facility], [facility], and [facility].  If no beds available discharge patient home to continue contact with the facilities for open bed.  Give patient resource information for other rehab facilities and outpatient resources.  Assunta Found, FNP-BC 09/25/2013 12:53 PM

## 2013-09-25 NOTE — ED Notes (Signed)
Social work at bedside.  

## 2013-09-25 NOTE — Consult Note (Signed)
Note reviewed and agreed with  

## 2013-09-25 NOTE — ED Notes (Signed)
Psychiatry at bedside.

## 2013-09-25 NOTE — ED Notes (Signed)
IVC rescinded 

## 2013-09-27 ENCOUNTER — Emergency Department: Payer: Self-pay | Admitting: Emergency Medicine

## 2013-09-27 LAB — COMPREHENSIVE METABOLIC PANEL
Alkaline Phosphatase: 95 U/L (ref 50–136)
Anion Gap: 6 — ABNORMAL LOW (ref 7–16)
BUN: 10 mg/dL (ref 7–18)
Calcium, Total: 8.9 mg/dL (ref 8.5–10.1)
Creatinine: 0.91 mg/dL (ref 0.60–1.30)
EGFR (African American): >60
EGFR (Non-African Amer.): >60
Glucose: 135 mg/dL — ABNORMAL HIGH (ref 65–99)
Osmolality: 280 (ref 275–301)
Potassium: 4 mmol/L (ref 3.5–5.1)
Total Protein: 7.4 g/dL (ref 6.4–8.2)

## 2013-09-27 LAB — URINALYSIS, COMPLETE
Bilirubin,UR: NEGATIVE
Glucose,UR: NEGATIVE mg/dL (ref 0–75)
Nitrite: NEGATIVE
Ph: 6 (ref 4.5–8.0)
Squamous Epithelial: NONE SEEN

## 2013-09-27 LAB — CBC
HCT: 45.1 % (ref 40.0–52.0)
HGB: 15.5 g/dL (ref 13.0–18.0)
MCHC: 34.4 g/dL (ref 32.0–36.0)
RDW: 13.3 % (ref 11.5–14.5)

## 2013-09-27 LAB — DRUG SCREEN, URINE
Barbiturates, Ur Screen: NEGATIVE (ref ?–200)
MDMA (Ecstasy)Ur Screen: NEGATIVE (ref ?–500)
Tricyclic, Ur Screen: NEGATIVE (ref ?–1000)

## 2013-09-27 LAB — ACETAMINOPHEN LEVEL: Acetaminophen: 2 ug/mL — ABNORMAL LOW

## 2013-09-27 LAB — TSH: Thyroid Stimulating Horm: 2.41 u[IU]/mL

## 2013-09-27 LAB — ETHANOL
Ethanol %: <0.003 % (ref 0.000–0.080)
Ethanol: <3 mg/dL

## 2013-09-27 LAB — SALICYLATE LEVEL: Salicylates, Serum: <1.7 mg/dL

## 2013-09-30 NOTE — Consult Note (Signed)
Agree with plan 

## 2014-08-23 ENCOUNTER — Encounter (HOSPITAL_COMMUNITY): Payer: Self-pay | Admitting: Emergency Medicine

## 2014-08-23 ENCOUNTER — Emergency Department (HOSPITAL_COMMUNITY)
Admission: EM | Admit: 2014-08-23 | Discharge: 2014-08-24 | Disposition: A | Discharge status: Home / Self Care | Payer: Federal, State, Local not specified - Other | Attending: Emergency Medicine | Admitting: Emergency Medicine

## 2014-08-23 DIAGNOSIS — Z590 Homelessness: Secondary | ICD-10-CM | POA: Insufficient documentation

## 2014-08-23 DIAGNOSIS — F4322 Adjustment disorder with anxiety: Secondary | ICD-10-CM | POA: Insufficient documentation

## 2014-08-23 DIAGNOSIS — Z720 Tobacco use: Secondary | ICD-10-CM | POA: Insufficient documentation

## 2014-08-23 DIAGNOSIS — M7981 Nontraumatic hematoma of soft tissue: Secondary | ICD-10-CM | POA: Insufficient documentation

## 2014-08-23 DIAGNOSIS — S9000XA Contusion of unspecified ankle, initial encounter: Secondary | ICD-10-CM | POA: Diagnosis present

## 2014-08-23 DIAGNOSIS — S9001XA Contusion of right ankle, initial encounter: Secondary | ICD-10-CM

## 2014-08-23 DIAGNOSIS — F4321 Adjustment disorder with depressed mood: Secondary | ICD-10-CM

## 2014-08-23 LAB — CBC WITH DIFFERENTIAL/PLATELET
Basophils Absolute: 0.1 10*3/uL (ref 0.0–0.1)
Basophils Relative: 1 % (ref 0–1)
Eosinophils Absolute: 0.1 10*3/uL (ref 0.0–0.7)
Eosinophils Relative: 1 % (ref 0–5)
HCT: 41.6 % (ref 39.0–52.0)
Hemoglobin: 14.8 g/dL (ref 13.0–17.0)
LYMPHS ABS: 2.7 10*3/uL (ref 0.7–4.0)
LYMPHS PCT: 24 % (ref 12–46)
MCH: 30 pg (ref 26.0–34.0)
MCHC: 35.6 g/dL (ref 30.0–36.0)
MCV: 84.2 fL (ref 78.0–100.0)
Monocytes Absolute: 1.6 10*3/uL — ABNORMAL HIGH (ref 0.1–1.0)
Monocytes Relative: 14 % — ABNORMAL HIGH (ref 3–12)
NEUTROS PCT: 60 % (ref 43–77)
Neutro Abs: 6.6 10*3/uL (ref 1.7–7.7)
Platelets: 361 10*3/uL (ref 150–400)
RBC: 4.94 MIL/uL (ref 4.22–5.81)
RDW: 12.7 % (ref 11.5–15.5)
WBC: 10.9 10*3/uL — ABNORMAL HIGH (ref 4.0–10.5)

## 2014-08-23 LAB — I-STAT CHEM 8, ED
BUN: 17 mg/dL (ref 6–23)
CHLORIDE: 103 meq/L (ref 96–112)
CREATININE: 1 mg/dL (ref 0.50–1.35)
Calcium, Ion: 1.14 mmol/L (ref 1.12–1.23)
Glucose, Bld: 119 mg/dL — ABNORMAL HIGH (ref 70–99)
HCT: 45 % (ref 39.0–52.0)
HEMOGLOBIN: 15.3 g/dL (ref 13.0–17.0)
POTASSIUM: 3.9 meq/L (ref 3.7–5.3)
SODIUM: 142 meq/L (ref 137–147)
TCO2: 26 mmol/L (ref 0–100)

## 2014-08-23 LAB — ETHANOL: Alcohol, Ethyl (B): <11 mg/dL (ref 0–11)

## 2014-08-23 MED ORDER — ACETAMINOPHEN 325 MG PO TABS: 650.0000 mg | ORAL_TABLET | ORAL | Status: DC | PRN

## 2014-08-23 MED ORDER — IBUPROFEN 200 MG PO TABS
600.0000 mg | ORAL_TABLET | Freq: Once | ORAL | Status: AC
  Administered 2014-08-23: 600 mg via ORAL
  Filled 2014-08-23: qty 3

## 2014-08-23 MED ORDER — HYDROXYZINE HCL 25 MG PO TABS: 50.0000 mg | ORAL_TABLET | Freq: Three times a day (TID) | ORAL | Status: DC | PRN

## 2014-08-23 MED ORDER — NICOTINE 21 MG/24HR TD PT24: 21.0000 mg | MEDICATED_PATCH | Freq: Every day | TRANSDERMAL | Status: DC

## 2014-08-23 MED ORDER — IBUPROFEN 200 MG PO TABS: 600.0000 mg | ORAL_TABLET | Freq: Three times a day (TID) | ORAL | Status: DC | PRN

## 2014-08-23 MED ORDER — TRAZODONE HCL 50 MG PO TABS
50.0000 mg | ORAL_TABLET | Freq: Every evening | ORAL | Status: DC | PRN
  Administered 2014-08-23: 50 mg via ORAL
  Filled 2014-08-23: qty 1

## 2014-08-23 NOTE — ED Notes (Signed)
Pt. Requesting to leave the hospital.

## 2014-08-23 NOTE — ED Notes (Signed)
Pt. Asking for Ativan for anxiety. NP consulted. Vistaril 50 mg every 8 hours ordered.

## 2014-08-23 NOTE — BH Assessment (Signed)
Received call for assessment. Spoke to Earley FavorGail Schulz, NP who said Pt was just released from jail today and went to the shelter but it was full. He called police and told them he was depressed and has a history of a suicide attempt. Tele-assessment will be initiated.  Harlin RainFord Ellis Ria CommentWarrick Jr, LPC, [facility] Triage Specialist [phone]

## 2014-08-23 NOTE — ED Notes (Signed)
Pt. Asking to wait on the Vistaril till "later".

## 2014-08-23 NOTE — ED Notes (Signed)
Pt. To [facility] from ED ambulatory without difficulty. [facility]. Pt. Is alert and oriented, warm and dry in no distress. Pt. Denies HI, and AVH. Pt. States he has SI to cut himself. Pt. Calm and cooperative. Pt. Encouraged to let Nursing staff know if he has concerns or needs.
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
## 2014-08-23 NOTE — ED Provider Notes (Signed)
CSN: [id number]     Arrival date & time 08/23/14  1956 History   None   This chart was scribed for non-physician practitioner, Earley Favor, FNP working with No att. providers found by Arlan Organ, ED Scribe. This patient was seen in room [facility] and the patient's care was started at 8:53 PM.   Chief Complaint  Patient presents with  . Ankle Pain   The history is provided by the patient. No language interpreter was used.    HPI Comments: [name] is a 23 y.o. male who presents to the Emergency Department complaining of constant, moderate R ankle pain that has been ongoing for approximately 1 year now. Pain has progressively worsened recently. Typically tolerable pain is rated 7310. Pt states he broke his R ankle 09/08/13 and was scheduled for reconstructive surgery. However, pt was noncompliant and did not go through with surgery. Pt denies any new injury or trauma. He has tried OTC Ibuprofen without mild temporary improvement for symptoms. Pt is here temporarily from [name] and recently was released from jail. Currently pt does not have any housing. Plan includes following up with a shelter in town this week.  [name] also mentions depression with last suicide attempt on 03/25/14. He denies any SI at this time.  Past Medical History  Diagnosis Date  . Mental disorder    History reviewed. No pertinent past surgical history. History reviewed. No pertinent family history. History  Substance Use Topics  . Smoking status: Current Every Day Smoker -- 0.50 packs/day for 11 years    Types: Cigarettes  . Smokeless tobacco: Not on file  . Alcohol Use: No     Comment: case daily    Review of Systems  Constitutional: Negative for fever and chills.  Musculoskeletal: Positive for arthralgias.  Psychiatric/Behavioral: Negative for suicidal ideas and self-injury.      Allergies  Review of patient's allergies indicates no known allergies.  Home Medications   Prior to  Admission medications   Not on File   Triage Vitals: BP 131/89  Pulse 96  Temp(Src) 97.9 F (36.6 C) (Oral)  Resp 20  SpO2 100%   Physical Exam  Nursing note and vitals reviewed. Constitutional: He appears well-developed and well-nourished. No distress.  HENT:  Head: Normocephalic and atraumatic.  Neck: Neck supple.  Cardiovascular: Normal rate and regular rhythm.   Pulmonary/Chest: Effort normal.  Musculoskeletal: Normal range of motion. He exhibits no edema and no tenderness.       Feet:  Neurological: He is alert.  Skin: He is not diaphoretic.  Psychiatric: His speech is normal and behavior is normal. Judgment normal. Cognition and memory are normal. He exhibits a depressed mood. He expresses no suicidal ideation.    ED Course  Procedures (including critical care time)  DIAGNOSTIC STUDIES: Oxygen Saturation is 100% on RA, Normal by my interpretation.    COORDINATION OF CARE: 8:53 PM- Will give Ibuprofen. Will order CBC, I-stat chem 8, drug screen panel, and ethanol. Discussed treatment plan with pt at bedside and pt agreed to plan.  t   Labs Review Labs Reviewed  CBC WITH DIFFERENTIAL - Abnormal; Notable for the following:    WBC 10.9 (*)    Monocytes Relative 14 (*)    Monocytes Absolute 1.6 (*)    All other components within normal limits  I-STAT CHEM 8, ED - Abnormal; Notable for the following:    Glucose, Bld 119 (*)    All other components within normal limits  ETHANOL    Imaging Review No results found.   EKG Interpretation None      MDM   Final diagnoses:  Ankle contusion, right, initial encounter  Adjustment disorder with anxious mood       I personally performed the services described in this documentation, which was scribed in my presence. The recorded information has been reviewed and is accurate.    Arman FilterGail K Maribella Kuna, NP 08/25/14 2053

## 2014-08-23 NOTE — ED Notes (Signed)
Patient changed into purple scrubs.  

## 2014-08-23 NOTE — BH Assessment (Signed)
Assessment complete. [name], [facility] LPC at [facility], confirms adult unit is at capacity. Gave clinical report to Alec Augustori Burkett, NP who agrees Pt meets criteria for inpatient psychiatric treatment. [facility] when a bed becomes available but TTS will contact other facilities for placement. Notified Alec FavorGail Schulz, NP and Alec DusterMichelle, RN of disposition.  [name], LPC, [facility] Triage Specialist [phone]

## 2014-08-23 NOTE — ED Notes (Signed)
Patient broke his right ankle 09/08/13 and was supposed to have reconstructive surgery.  He reports that he did not and his pain gradually worsens daily.  He also reports feeling depressed and a suicide attempt on 03/25/14.  He denies suicidal ideation at this time.

## 2014-08-24 ENCOUNTER — Encounter (HOSPITAL_COMMUNITY): Payer: Self-pay | Admitting: Psychiatry

## 2014-08-24 DIAGNOSIS — F4322 Adjustment disorder with anxiety: Secondary | ICD-10-CM

## 2014-08-24 DIAGNOSIS — F4321 Adjustment disorder with depressed mood: Secondary | ICD-10-CM

## 2014-08-24 DIAGNOSIS — S9000XA Contusion of unspecified ankle, initial encounter: Secondary | ICD-10-CM | POA: Diagnosis present

## 2014-08-24 NOTE — BH Assessment (Signed)
Joann Glover, AC at [facility], confirms adult unit is currently at capacity. Contacted the following facilities for placement:  BED AVAILABLE, FAXED CLINICAL INFORMATION: [facility], per Jennifer [facility], per [facility], per [facility], per Amy  AT CAPACITY: [facility], per [facility], per [facility], per [facility], per [facility], per Pat [facility], per Lisa [facility], per [facility], per Kristy [facility], per Tony[facility], per Paula [facility], per [facility], per Sheila [facility], per Kathleen [facility], per Claudine [facility], per Crystal  NO RESPONSE: [facility]   Tiegan Terpstra Ellis Skylor Schnapp Jr, LPC, [facility] Triage Specialist [phone]  
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
## 2014-08-26 ENCOUNTER — Emergency Department (HOSPITAL_COMMUNITY)
Admission: EM | Admit: 2014-08-26 | Discharge: 2014-08-30 | Disposition: A | Discharge status: Home / Self Care | Payer: Federal, State, Local not specified - Other | Attending: Emergency Medicine | Admitting: Emergency Medicine

## 2014-08-26 ENCOUNTER — Encounter (HOSPITAL_COMMUNITY): Payer: Self-pay | Admitting: Emergency Medicine

## 2014-08-26 DIAGNOSIS — Z720 Tobacco use: Secondary | ICD-10-CM | POA: Insufficient documentation

## 2014-08-26 DIAGNOSIS — F322 Major depressive disorder, single episode, severe without psychotic features: Secondary | ICD-10-CM | POA: Diagnosis present

## 2014-08-26 DIAGNOSIS — Z7289 Other problems related to lifestyle: Secondary | ICD-10-CM

## 2014-08-26 DIAGNOSIS — F3110 Bipolar disorder, current episode manic without psychotic features, unspecified: Secondary | ICD-10-CM | POA: Diagnosis present

## 2014-08-26 DIAGNOSIS — X789XXA Intentional self-harm by unspecified sharp object, initial encounter: Secondary | ICD-10-CM | POA: Insufficient documentation

## 2014-08-26 DIAGNOSIS — S51811A Laceration without foreign body of right forearm, initial encounter: Secondary | ICD-10-CM | POA: Insufficient documentation

## 2014-08-26 DIAGNOSIS — IMO0002 Reserved for concepts with insufficient information to code with codable children: Secondary | ICD-10-CM

## 2014-08-26 HISTORY — DX: Major depressive disorder, single episode, unspecified: F32.9

## 2014-08-26 HISTORY — DX: Depression, unspecified: F32.A

## 2014-08-26 HISTORY — DX: Anxiety disorder, unspecified: F41.9

## 2014-08-26 LAB — RAPID URINE DRUG SCREEN, HOSP PERFORMED
Amphetamines: NOT DETECTED
BENZODIAZEPINES: NOT DETECTED
Barbiturates: NOT DETECTED
COCAINE: NOT DETECTED
Opiates: NOT DETECTED
TETRAHYDROCANNABINOL: NOT DETECTED

## 2014-08-26 LAB — COMPREHENSIVE METABOLIC PANEL
ALBUMIN: 4.4 g/dL (ref 3.5–5.2)
ALT: 9 U/L (ref 0–53)
ANION GAP: 12 (ref 5–15)
AST: 13 U/L (ref 0–37)
Alkaline Phosphatase: 65 U/L (ref 39–117)
BUN: 11 mg/dL (ref 6–23)
CHLORIDE: 103 meq/L (ref 96–112)
CO2: 25 mEq/L (ref 19–32)
Calcium: 9.5 mg/dL (ref 8.4–10.5)
Creatinine, Ser: 0.86 mg/dL (ref 0.50–1.35)
GFR calc Af Amer: >90 mL/min (ref >90)
GFR calc non Af Amer: >90 mL/min (ref >90)
Glucose, Bld: 113 mg/dL — ABNORMAL HIGH (ref 70–99)
Potassium: 4.3 mEq/L (ref 3.7–5.3)
Sodium: 140 mEq/L (ref 137–147)
Total Bilirubin: 0.4 mg/dL (ref 0.3–1.2)
Total Protein: 7.7 g/dL (ref 6.0–8.3)

## 2014-08-26 LAB — CBC
HCT: 39.4 % (ref 39.0–52.0)
HEMOGLOBIN: 13.6 g/dL (ref 13.0–17.0)
MCH: 29.8 pg (ref 26.0–34.0)
MCHC: 34.5 g/dL (ref 30.0–36.0)
MCV: 86.2 fL (ref 78.0–100.0)
Platelets: 323 10*3/uL (ref 150–400)
RBC: 4.57 MIL/uL (ref 4.22–5.81)
RDW: 12.7 % (ref 11.5–15.5)
WBC: 9.4 10*3/uL (ref 4.0–10.5)

## 2014-08-26 LAB — SALICYLATE LEVEL: Salicylate Lvl: <2 mg/dL — ABNORMAL LOW (ref 2.8–20.0)

## 2014-08-26 LAB — ETHANOL: Alcohol, Ethyl (B): <11 mg/dL (ref 0–11)

## 2014-08-26 LAB — ACETAMINOPHEN LEVEL

## 2014-08-26 MED ORDER — ALUM & MAG HYDROXIDE-SIMETH 200-200-20 MG/5ML PO SUSP
30.0000 mL | ORAL | Status: DC | PRN
  Administered 2014-08-30: 30 mL via ORAL
  Filled 2014-08-26: qty 30

## 2014-08-26 MED ORDER — ONDANSETRON HCL 4 MG PO TABS: 4.0000 mg | ORAL_TABLET | Freq: Three times a day (TID) | ORAL | Status: DC | PRN

## 2014-08-26 MED ORDER — IBUPROFEN 200 MG PO TABS: 600.0000 mg | ORAL_TABLET | Freq: Three times a day (TID) | ORAL | Status: DC | PRN

## 2014-08-26 MED ORDER — ACETAMINOPHEN 325 MG PO TABS: 650.0000 mg | ORAL_TABLET | ORAL | Status: DC | PRN

## 2014-08-26 MED ORDER — NICOTINE 21 MG/24HR TD PT24: 21.0000 mg | MEDICATED_PATCH | Freq: Every day | TRANSDERMAL | Status: DC

## 2014-08-26 MED ORDER — LIDOCAINE-EPINEPHRINE (PF) 2 %-1:200000 IJ SOLN
10.0000 mL | Freq: Once | INTRAMUSCULAR | Status: AC
  Administered 2014-08-26: 10 mL
  Filled 2014-08-26: qty 10

## 2014-08-26 MED ORDER — ZOLPIDEM TARTRATE 5 MG PO TABS
5.0000 mg | ORAL_TABLET | Freq: Every evening | ORAL | Status: DC | PRN
  Administered 2014-08-26: 5 mg via ORAL
  Filled 2014-08-26: qty 1

## 2014-08-26 MED ORDER — LORAZEPAM 1 MG PO TABS
1.0000 mg | ORAL_TABLET | Freq: Three times a day (TID) | ORAL | Status: DC | PRN
Start: 2014-08-26 — End: 2014-08-28
  Administered 2014-08-26 – 2014-08-28 (×3): 1 mg via ORAL
  Filled 2014-08-26 (×3): qty 1

## 2014-08-26 NOTE — ED Notes (Signed)
[facility] Expected date:  Expected time:  Means of arrival:  Comments: T-3

## 2014-08-26 NOTE — ED Notes (Signed)
Patient presents anxious but cooperative; passive SI stating that he just wants to die but does not have any active ideation at this time, endorses hopelessness, denies HI/AVH. NAD
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
## 2014-08-26 NOTE — ED Notes (Signed)
Bed: [facility] Expected date: 08/26/14 Expected time: 7:32 PM Means of arrival: Ambulance Comments: 22 yoM  Suicidal, arm lacerations  Triage

## 2014-08-26 NOTE — ED Notes (Addendum)
Pt very reluctant to speak about events. Pt states he did want to kill himself but believed no one would care if he died instead this was an event of self harm. Pt states he is having difficulties in his relationship. Pt A & O. [place], pt attempted to cut himself in May of this year, December 9,2009 pt ingested 180 Advil PMs.

## 2014-08-26 NOTE — ED Provider Notes (Signed)
CSN: [id number]     Arrival date & time 08/26/14  1945 History   First MD Initiated Contact with Patient 08/26/14 2146     This chart was scribed for non-physician practitioner, Antony Madura, PA-C working with Dr. Purvis Sheffield by Arlan Organ, ED Scribe. This patient was seen in room [facility] and the patient's care was started at 5:52 AM.   Chief Complaint  Patient presents with  . Suicidal   The history is provided by the patient. No language interpreter was used.    HPI Comments: [name] is a 23 y.o. male who presents to the Emergency Department her for suicidal ideation today. Pt admits to cutting himself with a straight razor approximately 4 hours ago. Laceration noted to the upper and lower R arm. He denies any SI at this time. However, triage note states "he did want to kill himself but believes no on would care if he died; instead this was an event of self harm". Last suicide attempt 07/22/2014 and attempted to cut himself in May of this year. When asked questions regarding this evenings incident pt stated "i dont have to tell you anything i just want my arm fixed". Pt proceeded to ask questions regarding his chances of admission to [facility] for tonight's suicide attempt. He denies any alcohol consumption or drug use.   Past Medical History  Diagnosis Date  . Mental disorder   . Depression   . Anxiety    History reviewed. No pertinent past surgical history. No family history on file. History  Substance Use Topics  . Smoking status: Current Every Day Smoker -- 0.50 packs/day for 11 years    Types: Cigarettes  . Smokeless tobacco: Not on file  . Alcohol Use: No     Comment: case daily    Review of Systems  Constitutional: Negative for fever and chills.  Skin: Positive for wound.  Psychiatric/Behavioral: Positive for suicidal ideas and self-injury.  All other systems reviewed and are negative.   Allergies  Review of patient's allergies indicates no known  allergies.  Home Medications   Prior to Admission medications   Not on File   Triage Vitals: BP 141/75  Pulse 99  Temp(Src) 98.1 F (36.7 C) (Oral)  Resp 18  Ht 5\' 8"  (1.727 m)  Wt 150 lb (68.04 kg)  BMI 22.81 kg/m2  SpO2 98%   Physical Exam  Nursing note and vitals reviewed. Constitutional: He is oriented to person, place, and time. He appears well-developed and well-nourished. No distress.  Nontoxic/nonseptic appearing  HENT:  Head: Normocephalic and atraumatic.  Eyes: Conjunctivae and EOM are normal. No scleral icterus.  Neck: Normal range of motion. Neck supple.  Cardiovascular: Normal rate, regular rhythm and intact distal pulses.   Distal radial pulse 2+ in right upper extremity.  Pulmonary/Chest: Effort normal. No respiratory distress.  Musculoskeletal: Normal range of motion.  Neurological: He is alert and oriented to person, place, and time. He exhibits normal muscle tone. Coordination normal.  No gross sensory deficits. Patient able to wiggle all fingers of R hand.  Skin: Skin is warm and dry. No rash noted. He is not diaphoretic. No erythema. No pallor.  3 superficial lacerations to upper R harm with 2 lacerations through dermis with minimal bleeding. Patient also with 6cm laceration through dermis on dorsal R mid forearm. Bleeding controlled.  Psychiatric: His speech is normal. His affect is inappropriate. He is agitated and withdrawn. He expresses inappropriate judgment. He expresses suicidal ideation. He expresses no  homicidal ideation. He expresses no homicidal plans.    ED Course  Procedures (including critical care time)  DIAGNOSTIC STUDIES: Oxygen Saturation is 98% on RA, Normal by my interpretation.    COORDINATION OF CARE: 5:52 AM- Will perform laceration repair. Discussed treatment plan with pt at bedside and pt agreed to plan.     LACERATION REPAIR Performed by: Antony Madura, PA-C  Consent: Verbal consent obtained. Risks and benefits: risks,  benefits and alternatives were discussed Patient identity confirmed: provided demographic data Time out performed prior to procedure Prepped and Draped in normal sterile fashion Wound explored Laceration Location: R forearm  Laceration Length: 6cm No Foreign Bodies seen or palpated Anesthesia: local infiltration Local anesthetic: lidocaine 2% without epinephrine Anesthetic total: 5 ml Irrigation method: syringe Amount of cleaning: standard Skin closure: 4-0 Prolene Number of sutures or staples: 5 Technique: Simple and Horizontal Mattress Patient tolerance: Patient tolerated the procedure well with no immediate complications.   LACERATION REPAIR Performed by: Antony Madura, PA-C  Consent: Verbal consent obtained. Risks and benefits: risks, benefits and alternatives were discussed Patient identity confirmed: provided demographic data Time out performed prior to procedure Prepped and Draped in normal sterile fashion Wound explored Laceration Location: upper R arm Laceration Length: 3 cm No Foreign Bodies seen or palpated Anesthesia: local infiltration Local anesthetic: lidocaine 2% without epinephrine Anesthetic total: 3 ml Irrigation method: syringe Amount of cleaning: standard Skin closure: 4-0 Prolene Number of sutures or staples: 3 Technique: Simple Inturrupted  Patient tolerance: Patient tolerated the procedure well with no immediate complications.   LACERATION REPAIR Performed by: Antony Madura, PA-C  Consent: Verbal consent obtained. Risks and benefits: risks, benefits and alternatives were discussed Patient identity confirmed: provided demographic data Time out performed prior to procedure Prepped and Draped in normal sterile fashion Wound explored Laceration Location: R upper arm Laceration Length: 2 cm No Foreign Bodies seen or palpated Anesthesia: local infiltration Local anesthetic: lidocaine 2% without epinephrine Anesthetic total: 2 ml Irrigation method:  syringe Amount of cleaning: standard Skin closure: 4-0 Prolene Number of sutures or staples: 2 Technique: Simple Inturrupted  Patient tolerance: Patient tolerated the procedure well with no immediate complications.   Labs Review Labs Reviewed  COMPREHENSIVE METABOLIC PANEL - Abnormal; Notable for the following:    Glucose, Bld 113 (*)    All other components within normal limits  SALICYLATE LEVEL - Abnormal; Notable for the following:    Salicylate Lvl <2.0 (*)    All other components within normal limits  ACETAMINOPHEN LEVEL  CBC  ETHANOL  URINE RAPID DRUG SCREEN (HOSP PERFORMED)    Imaging Review No results found.   EKG Interpretation None      MDM   Final diagnoses:  Laceration of forearm, right, initial encounter  Laceration of upper arm, right, initial encounter  Self-inflicted injury  Suicide threat or attempt    23 year old male presents to the emergency department for further evaluation of suicidal ideations. Patient with lacerations to right forearm which were self-inflicted with a preserved. Patient is very rude on initial presentation. He is cursing frequently and physically agitated. Patient initially answers all questions inappropriately. He is later apologetic and more forthcoming with his history. Patient neurovascularly intact on exam today. Lacerations repaired in ED without complication. Labs reviewed and patient medically cleared. He is currently pending TTS evaluation given his endorsed SI. Patient verbally instructed to have sutures removed in 10 days.  I personally performed the services described in this documentation, which was scribed in my presence.  The recorded information has been reviewed and is accurate.    Filed Vitals:   08/26/14 1949 08/26/14 2028  BP: 141/75   Pulse: 99   Temp: 98.1 F (36.7 C)   TempSrc: Oral   Resp: 18   Height:  5\' 8"  (1.727 m)  Weight:  150 lb (68.04 kg)  SpO2: 98%      Antony MaduraKelly Carr Shartzer, PA-C 08/27/14 [phone]

## 2014-08-27 ENCOUNTER — Encounter (HOSPITAL_COMMUNITY): Payer: Self-pay | Admitting: *Deleted

## 2014-08-27 DIAGNOSIS — F322 Major depressive disorder, single episode, severe without psychotic features: Secondary | ICD-10-CM | POA: Diagnosis present

## 2014-08-27 DIAGNOSIS — F332 Major depressive disorder, recurrent severe without psychotic features: Secondary | ICD-10-CM

## 2014-08-27 MED ORDER — HALOPERIDOL LACTATE 5 MG/ML IJ SOLN
5.0000 mg | Freq: Once | INTRAMUSCULAR | Status: AC
  Administered 2014-08-27: 5 mg via INTRAMUSCULAR
  Filled 2014-08-27: qty 1

## 2014-08-27 MED ORDER — OLANZAPINE 10 MG PO TBDP: 10.0000 mg | ORAL_TABLET | Freq: Three times a day (TID) | ORAL | Status: DC | PRN

## 2014-08-27 MED ORDER — LORAZEPAM 2 MG/ML IJ SOLN
2.0000 mg | Freq: Once | INTRAMUSCULAR | Status: AC
  Administered 2014-08-27: 2 mg via INTRAMUSCULAR
  Filled 2014-08-27: qty 1

## 2014-08-27 MED ORDER — FLUOXETINE HCL 20 MG PO CAPS
20.0000 mg | ORAL_CAPSULE | Freq: Every day | ORAL | Status: DC
  Filled 2014-08-27 (×2): qty 1

## 2014-08-27 MED ORDER — DIPHENHYDRAMINE HCL 50 MG/ML IJ SOLN
50.0000 mg | Freq: Once | INTRAMUSCULAR | Status: AC
  Administered 2014-08-27: 50 mg via INTRAMUSCULAR
  Filled 2014-08-27: qty 1

## 2014-08-27 NOTE — ED Notes (Addendum)
[facility]

## 2014-08-27 NOTE — ED Provider Notes (Signed)
Medical screening examination/treatment/procedure(s) were performed by non-physician practitioner and as supervising physician I was immediately available for consultation/collaboration.   EKG Interpretation None        Purvis SheffieldForrest Lakeyia Surber, MD 08/27/14 [phone]

## 2014-08-27 NOTE — ED Notes (Signed)
Patient irritated after being caught with a pack of cigarettes and attempting to smoke a cigarette in the bathroom. Patient being demanding and uncooperative.
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
## 2014-08-27 NOTE — ED Notes (Signed)
Pt sleeping, easily aroused.  Pt declined medication and to have VS taken. PO fluids offered

## 2014-08-27 NOTE — ED Notes (Signed)
Pt declined to take the prozac, or allow dressing changes.-No bleeding noted, dressings intact.

## 2014-08-27 NOTE — ED Notes (Signed)
Up tot he bathroom to shower and change scrubs 

## 2014-08-27 NOTE — Progress Notes (Signed)
  CARE MANAGEMENT ED NOTE 08/27/2014  Patient:  [name],[name]   Account Number:  [id number]  Date Initiated:  08/27/2014  Documentation initiated by:  Radford PaxFERRERO,Alicia Ackert  Subjective/Objective Assessment:   Patient presents to Ed with hopelessness and SI     Subjective/Objective Assessment Detail:   Patient with pmhx of mental disorder, depressio, anxiety     Action/Plan:   Action/Plan Detail:   Anticipated DC Date:       Status Recommendation to Physician:   Result of Recommendation:    Other ED Services  Consult Working Plan    DC Planning Services  Other  PCP issues    Choice offered to / List presented to:            Status of service:  Completed, signed off  ED Comments:   ED Comments Detail:  EDCM spoke to patient at bedside. Patient confirms he does not have a pcp or [place]. EDCM [facility], informed patient of services there and walk in times.  EDCM also provided patient with list of pcps who accept self pay patients, list of discount pharmacies and websites needymeds.org and GoodRX.com for medication assistance, phone number to inquire about the orange card, phone number to inquire about Mediciad, phone number to inquire about the Affordable Care Act, financial resources in the community such as local churches, salvation army, urban ministries, and dental assistance for uninsured patients. Patient thankfulf or resources.  No further EDCM needs at this time.

## 2014-08-27 NOTE — ED Notes (Signed)
Patient uncooperative, refusing vitals, lying in bed; NAD
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
## 2014-08-27 NOTE — ED Notes (Addendum)
Up in room eating, still declines to take prozac or VS, dressings intact, no bleeding noted.

## 2014-08-28 DIAGNOSIS — F3110 Bipolar disorder, current episode manic without psychotic features, unspecified: Secondary | ICD-10-CM | POA: Diagnosis present

## 2014-08-28 LAB — CBG MONITORING, ED: Glucose-Capillary: 288 mg/dL — ABNORMAL HIGH (ref 70–99)

## 2014-08-28 MED ORDER — DIPHENHYDRAMINE HCL 50 MG/ML IJ SOLN: 50.0000 mg | Freq: Three times a day (TID) | INTRAMUSCULAR | Status: DC | PRN

## 2014-08-28 MED ORDER — ZIPRASIDONE MESYLATE 20 MG IM SOLR: 20.0000 mg | Freq: Three times a day (TID) | INTRAMUSCULAR | Status: DC | PRN

## 2014-08-28 MED ORDER — NICOTINE POLACRILEX 2 MG MT GUM
2.0000 mg | CHEWING_GUM | OROMUCOSAL | Status: DC | PRN
  Administered 2014-08-28 – 2014-08-29 (×2): 2 mg via ORAL
  Filled 2014-08-28 (×3): qty 1

## 2014-08-28 MED ORDER — QUETIAPINE FUMARATE 50 MG PO TABS
50.0000 mg | ORAL_TABLET | Freq: Two times a day (BID) | ORAL | Status: DC
  Administered 2014-08-28 (×2): 50 mg via ORAL
  Filled 2014-08-28: qty 1
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
## 2014-08-28 NOTE — Progress Notes (Signed)
[facility] Coca-[employer]aLiaison Stacy,  Provided pt with a [employer] Orange Card application, highlighting [facility] to help patient establish primary care.

## 2014-08-28 NOTE — ED Notes (Signed)
Patient up at nurse's station requesting ativan.  Explained to him that ativan was discontinued.  Patient became anxious and asked, "why did they do that??"  Informed patient that he was getting seroquel twice daily.  Patient stated, "I refuse to take it unless I'm forced to."

## 2014-08-28 NOTE — ED Notes (Signed)
Patient refused vitals tonight Rn Jillyn HiddenGary was made aware

## 2014-08-28 NOTE — ED Notes (Signed)
Patient came to the NS inquiring if he was IVC'd and if he would be able to go home in the morning; states that he is not suicidal; patient apologized for his behavior the day before; states that he was just having a bad day. NAD

## 2014-08-28 NOTE — ED Notes (Signed)
Patient refused his vitals Rn

## 2014-08-28 NOTE — Progress Notes (Signed)
Pt has been assessed and meets inpatient criteria. Pt.'s clinicals [facility] out [facility]  Will continue to pursue placement.  Derrell Lollingoris Chanice Brenton, MSW Clinical Social Worker [phone]

## 2014-08-28 NOTE — ED Notes (Signed)
Pt refused vitals. Pt stated " I dont feel good right now. Come back later when I want you to."

## 2014-08-28 NOTE — ED Notes (Signed)
Patient on phone visibly sobbing and upset.
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
## 2014-08-28 NOTE — Progress Notes (Signed)
Patient refused evening vital signs stating "No I'm good."

## 2014-08-28 NOTE — ED Notes (Addendum)
CBG reading entered at 2053 was in error. This patient does not require CGGs. This reading was on another patient.
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
## 2014-08-28 NOTE — ED Notes (Signed)
Pt. Refuses to allow this nurse to check the repaired lacerations on his right arm.

## 2014-08-28 NOTE — ED Notes (Signed)
Report received from Caroline RN. Pt. Alert and oriented in no distress denies SI, HI, AVH and pain. Will continue to monitor for safety. Pt. Instructed to come to me with problems or concerns. Q 15 minute checks continue. 

## 2014-08-29 ENCOUNTER — Encounter (HOSPITAL_COMMUNITY): Payer: Self-pay | Admitting: Psychiatry

## 2014-08-29 DIAGNOSIS — F322 Major depressive disorder, single episode, severe without psychotic features: Secondary | ICD-10-CM

## 2014-08-29 DIAGNOSIS — R45851 Suicidal ideations: Secondary | ICD-10-CM

## 2014-08-29 MED ORDER — HYDROXYZINE HCL 25 MG PO TABS
25.0000 mg | ORAL_TABLET | Freq: Three times a day (TID) | ORAL | Status: DC | PRN
  Administered 2014-08-29 (×2): 25 mg via ORAL
  Filled 2014-08-29 (×2): qty 1

## 2014-08-29 MED ORDER — TRAZODONE HCL 100 MG PO TABS
100.0000 mg | ORAL_TABLET | Freq: Every day | ORAL | Status: DC
  Administered 2014-08-29: 100 mg via ORAL
  Filled 2014-08-29: qty 1

## 2014-08-29 NOTE — ED Notes (Signed)
Talking quietly w/ mom

## 2014-08-29 NOTE — ED Notes (Signed)
Pt is aware that he is not going ot be dc'd and requesting something for anxiety.  Pt declined that visteril reporting that it will not help.

## 2014-08-29 NOTE — ED Notes (Signed)
Up to the desk requesting visteril

## 2014-08-29 NOTE — ED Notes (Signed)
Report received from Janie RN. Pt. Alert and oriented in no distress denies SI, HI, AVH and pain. Will continue to monitor for safety. Pt. Instructed to come to me with problems or concerns. Q 15 minute checks continue. 

## 2014-08-29 NOTE — ED Notes (Signed)
Up to the bathroom to shower 

## 2014-08-29 NOTE — ED Notes (Signed)
Remains calm, cooperative, but remains concerned about his probation situation

## 2014-08-29 NOTE — ED Notes (Signed)
[employer] dispatch contacted w/ patients permission and request--They  have contacted (left a message)  the patients Chief  parole officer to inform her that he is a patient here and was not able to keep his appt w/ his parole officer as scheduled. Pt will need to call Monday of necessary.

## 2014-08-29 NOTE — ED Notes (Signed)
Pt's mom into see 
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
## 2014-08-29 NOTE — ED Notes (Signed)
Up to the desk, nad 

## 2014-08-29 NOTE — ED Notes (Signed)
Up on the phone 

## 2014-08-29 NOTE — ED Notes (Signed)
Dr Tenny Crawoss and Catha NottinghamJamison NP into see

## 2014-08-29 NOTE — ED Notes (Signed)
Sitting in room eatting snack, watching tv

## 2014-08-29 NOTE — ED Notes (Signed)
Pts. Lacerations healing well without redness or drainage.

## 2014-08-29 NOTE — BH Assessment (Signed)
Binnie RailJoann Glover, [facility], confirms adult unit is currently at capacity. Contacted the following facilities for placement:  INFORMATION HAS BEEN RECEIVED AND PT IS UNDER REVIEW: [facility], per Elease HashimotoPatricia  AT CAPACITY: [facility], per [facility], per [facility], per [facility], per [facility], per [facility], per Dimas AlexandriaKathy [facility], per [facility], per [facility], per [facility], per Kennieth RadSheila Brynn Marr, per [facility], per [facility], per [facility], per J.J.  PT DECLINED: [facility] [address]   Livio Ledwith Ellis Patsy BaltimoreWarrick Jr, WisconsinLPC, [facility] Triage Specialist [phone]

## 2014-08-29 NOTE — ED Notes (Signed)
Up to the bathroom 

## 2014-08-29 NOTE — ED Notes (Signed)
Patient refused vitals.

## 2014-08-30 DIAGNOSIS — F339 Major depressive disorder, recurrent, unspecified: Secondary | ICD-10-CM

## 2014-08-30 MED ORDER — HYDROXYZINE HCL 25 MG PO TABS: 25.0000 mg | ORAL_TABLET | Freq: Three times a day (TID) | ORAL | Status: DC | PRN

## 2014-08-30 NOTE — ED Notes (Signed)
In hall talking w/ [employer] officer

## 2014-08-30 NOTE — Progress Notes (Signed)
[name] completed [facility] referral for pt.  Pt is under IVC at this time.  [facility] NWGN#[id number]#[id number] good until 09/06/14.  [facility] has added pt to waitlist [facility].  Blain PaisMichelle L Rosalea Withrow, [name]/NS

## 2014-08-30 NOTE — ED Notes (Signed)
Talking w/ [facility]

## 2014-08-30 NOTE — ED Notes (Signed)
Dr Tenny Crawross  Into see
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
## 2014-08-30 NOTE — ED Notes (Signed)
Up on the phone 

## 2014-08-30 NOTE — ED Notes (Addendum)
Written dc instructions and rx reviewed w/ pt, pt verbalized understanding.  Pt encouraged to follow up and take medications as directed; to return/seek treatment for andy suicidal/homicidal thoughts urges.  Pt verbalized understanding.  Pt ambulatory w/o difficulty to dc window w/ [name], belongings returned after leaving the unit.

## 2014-08-30 NOTE — ED Notes (Addendum)
IVC has been rescinded Mom to pick pt up around 1315, heartburn relieved w/ maylox

## 2014-08-30 NOTE — ED Notes (Signed)
Up in hall [facility]

## 2014-08-30 NOTE — [facility] Suicide Risk Assessment (Signed)
   Demographic Factors:  Adolescent or young adult, Low socioeconomic status, Living alone and Unemployed  Total Time spent with patient: 45 minutes  Psychiatric Specialty Exam: Physical Exam  Constitutional: He is oriented to person, place, and time. He appears well-developed and well-nourished.  HENT:  Head: Normocephalic and atraumatic.  Neck: Normal range of motion.  Musculoskeletal: Normal range of motion.  Neurological: He is alert and oriented to person, place, and time.    Review of Systems  Constitutional: Negative.   HENT: Negative.   Eyes: Negative.   Respiratory: Negative.   Cardiovascular: Negative.   Gastrointestinal: Negative.   Genitourinary: Negative.   Musculoskeletal: Negative.   Skin: Negative.   Neurological: Negative.   Endo/Heme/Allergies: Negative.   Psychiatric/Behavioral: The patient is nervous/anxious.     Blood pressure 118/71, pulse 71, temperature 98.7 F (37.1 C), temperature source Oral, resp. rate 16, height 5\' 8"  (1.727 m), weight 150 lb (68.04 kg), SpO2 99.00%.Body mass index is 22.81 kg/(m^2).  General Appearance: Casual and Fairly Groomed  Patent attorneyye Contact::  Fair  Speech:  Clear and Coherent  Volume:  Normal  Mood:  Anxious  Affect:  Congruent  Thought Process:  Circumstantial  Orientation:  Full (Time, Place, and Person)  Thought Content:  Rumination  Suicidal Thoughts:  No  Homicidal Thoughts:  No  Memory:  Immediate;   Good Recent;   Good Remote;   Good  Judgement:  Fair  Insight:  Lacking  Psychomotor Activity:  Restlessness  Concentration:  Fair  Recall:  [employer]Fair  Fund of Knowledge:Fair  Language: Fair  Akathisia:  No  Handed:  Right  AIMS (if indicated):     Assets:  Communication Skills Desire for Improvement Resilience  Sleep:       Musculoskeletal: Strength & Muscle Tone: within normal limits Gait & Station: normal Patient leans: N/A   Mental Status Per Nursing Assessment::   On Admission:     Current Mental  Status by Physician: no current suicidal ideation  Loss Factors: Legal issues  Historical Factors: Impulsivity  Risk Reduction Factors:   Sense of responsibility to family  Continued Clinical Symptoms:  Depression:   Impulsivity  Cognitive Features That Contribute To Risk:  Closed-mindedness    Suicide Risk:  Minimal: No identifiable suicidal ideation.  Patients presenting with no risk factors but with morbid ruminations; may be classified as minimal risk based on the severity of the depressive symptoms  Discharge Diagnoses:   AXIS I:  Depressive Disorder NOS AXIS II:  Deferred AXIS III:   Past Medical History  Diagnosis Date  . Mental disorder   . Depression   . Anxiety    AXIS IV:  other psychosocial or environmental problems and problems related to legal system/crime AXIS V:  61-70 mild symptoms  Plan Of Care/Follow-up recommendations:  Activity:  normal Diet:  normal Tests:  none Other:  none follow up at local mental clinic, resources given  Is patient on multiple antipsychotic therapies at discharge:no   Has Patient had three or more failed trials of antipsychotic monotherapy by history:  no Recommended Plan for Multiple Antipsychotic Therapies:no     ROSS, Laser Surgery Holding Company LtdDEBORAH 08/30/2014, 12:02 PM

## 2014-09-05 NOTE — ED Provider Notes (Signed)
Medical screening examination/treatment/procedure(s) were performed by non-physician practitioner and as supervising physician I was immediately available for consultation/collaboration.   EKG Interpretation None       Alec HornJohn M Lache Dagher, MD 09/05/14 2220

## 2015-01-22 ENCOUNTER — Emergency Department (HOSPITAL_COMMUNITY)
Admission: EM | Admit: 2015-01-22 | Discharge: 2015-01-22 | Disposition: A | Discharge status: Other Acute Inpatient Hospital | Payer: Self-pay | Attending: Emergency Medicine | Admitting: Emergency Medicine

## 2015-01-22 ENCOUNTER — Inpatient Hospital Stay (HOSPITAL_COMMUNITY)
Admission: AD | Admit: 2015-01-22 | Discharge: 2015-01-24 | DRG: 885 | Disposition: A | Discharge status: Home / Self Care | Payer: No Typology Code available for payment source | Source: Intra-hospital | Attending: Psychiatry | Admitting: Psychiatry

## 2015-01-22 ENCOUNTER — Encounter (HOSPITAL_COMMUNITY): Payer: Self-pay | Admitting: *Deleted

## 2015-01-22 DIAGNOSIS — R45851 Suicidal ideations: Secondary | ICD-10-CM | POA: Diagnosis present

## 2015-01-22 DIAGNOSIS — F332 Major depressive disorder, recurrent severe without psychotic features: Principal | ICD-10-CM | POA: Diagnosis present

## 2015-01-22 DIAGNOSIS — F17210 Nicotine dependence, cigarettes, uncomplicated: Secondary | ICD-10-CM | POA: Diagnosis present

## 2015-01-22 DIAGNOSIS — F329 Major depressive disorder, single episode, unspecified: Secondary | ICD-10-CM | POA: Diagnosis present

## 2015-01-22 DIAGNOSIS — Z87828 Personal history of other (healed) physical injury and trauma: Secondary | ICD-10-CM | POA: Insufficient documentation

## 2015-01-22 DIAGNOSIS — Z720 Tobacco use: Secondary | ICD-10-CM | POA: Insufficient documentation

## 2015-01-22 DIAGNOSIS — F339 Major depressive disorder, recurrent, unspecified: Secondary | ICD-10-CM | POA: Diagnosis present

## 2015-01-22 LAB — COMPREHENSIVE METABOLIC PANEL
ALT: 13 U/L (ref 0–53)
AST: 19 U/L (ref 0–37)
Albumin: 5 g/dL (ref 3.5–5.2)
Alkaline Phosphatase: 66 U/L (ref 39–117)
Anion gap: 9 (ref 5–15)
BILIRUBIN TOTAL: 0.8 mg/dL (ref 0.3–1.2)
BUN: 7 mg/dL (ref 6–23)
CALCIUM: 9.8 mg/dL (ref 8.4–10.5)
CO2: 25 mmol/L (ref 19–32)
Chloride: 107 mmol/L (ref 96–112)
Creatinine, Ser: 1.08 mg/dL (ref 0.50–1.35)
GFR calc Af Amer: >90 mL/min (ref >90)
GFR calc non Af Amer: >90 mL/min (ref >90)
Glucose, Bld: 100 mg/dL — ABNORMAL HIGH (ref 70–99)
Potassium: 3.7 mmol/L (ref 3.5–5.1)
SODIUM: 141 mmol/L (ref 135–145)
TOTAL PROTEIN: 8 g/dL (ref 6.0–8.3)

## 2015-01-22 LAB — CBC
HCT: 46.9 % (ref 39.0–52.0)
Hemoglobin: 16.3 g/dL (ref 13.0–17.0)
MCH: 30.2 pg (ref 26.0–34.0)
MCHC: 34.8 g/dL (ref 30.0–36.0)
MCV: 86.9 fL (ref 78.0–100.0)
Platelets: 362 10*3/uL (ref 150–400)
RBC: 5.4 MIL/uL (ref 4.22–5.81)
RDW: 13.3 % (ref 11.5–15.5)
WBC: 8.2 10*3/uL (ref 4.0–10.5)

## 2015-01-22 LAB — ACETAMINOPHEN LEVEL

## 2015-01-22 LAB — SALICYLATE LEVEL: Salicylate Lvl: <4 mg/dL (ref 2.8–20.0)

## 2015-01-22 LAB — ETHANOL

## 2015-01-22 MED ORDER — LORAZEPAM 1 MG PO TABS
0.5000 mg | ORAL_TABLET | Freq: Once | ORAL | Status: AC
  Administered 2015-01-22: 0.5 mg via ORAL
  Filled 2015-01-22: qty 1

## 2015-01-22 MED ORDER — HYDROXYZINE HCL 25 MG PO TABS: 25.0000 mg | ORAL_TABLET | Freq: Three times a day (TID) | ORAL | Status: DC | PRN

## 2015-01-22 NOTE — ED Notes (Signed)
[facility] called for transport. 

## 2015-01-22 NOTE — ED Notes (Signed)
The pts girlfriend left him last night.  He feels depressed and suicidal.  Hx of the same.  He is a cutter and that was his plan

## 2015-01-22 NOTE — ED Notes (Signed)
Th pt has been wanded      Comptrolleritter at the bedside

## 2015-01-22 NOTE — ED Provider Notes (Signed)
CSN: [id number]     Arrival date & time 01/22/15  2019 History   First MD Initiated Contact with Patient 01/22/15 2057     Chief Complaint  Patient presents with  . Psychiatric Evaluation     (Consider location/radiation/quality/duration/timing/severity/associated sxs/prior Treatment) Patient is a 24 y.o. male presenting with mental health disorder. The history is provided by the patient.  Mental Health Problem Presenting symptoms: suicidal thoughts   Patient accompanied by: mother. Degree of incapacity (severity):  Mild Onset quality:  Gradual Duration:  1 day Timing:  Constant Progression:  Worsening Chronicity:  Recurrent Context: stressful life event   Treatment compliance:  Some of the time Relieved by:  Nothing Worsened by:  Nothing tried Ineffective treatments:  None tried Associated symptoms: no abdominal pain, no chest pain and no headaches     Past Medical History  Diagnosis Date  . Mental disorder   . Depression   . Anxiety    History reviewed. No pertinent past surgical history. No family history on file. History  Substance Use Topics  . Smoking status: Current Every Day Smoker -- 0.50 packs/day for 11 years    Types: Cigarettes  . Smokeless tobacco: Not on file  . Alcohol Use: No     Comment: case daily    Review of Systems  Constitutional: Negative for fever.  HENT: Negative for drooling and rhinorrhea.   Eyes: Negative for pain.  Respiratory: Negative for cough and shortness of breath.   Cardiovascular: Negative for chest pain and leg swelling.  Gastrointestinal: Negative for nausea, vomiting, abdominal pain and diarrhea.  Genitourinary: Negative for dysuria and hematuria.  Musculoskeletal: Negative for gait problem and neck pain.  Skin: Negative for color change.  Neurological: Negative for numbness and headaches.  Hematological: Negative for adenopathy.  Psychiatric/Behavioral: Positive for suicidal ideas. Negative for behavioral problems.   All other systems reviewed and are negative.     Allergies  Review of patient's allergies indicates no known allergies.  Home Medications   Prior to Admission medications   Medication Sig Start Date End Date Taking? Authorizing Provider  hydrOXYzine (ATARAX/VISTARIL) 25 MG tablet Take 1 tablet (25 mg total) by mouth 3 (three) times daily as needed (anxiety). Patient not taking: Reported on 01/22/2015 08/30/14   Diannia Ruder, MD   BP 125/77 mmHg  Pulse 101  Temp(Src) 97.9 F (36.6 C) (Oral)  Resp 18  SpO2 97% Physical Exam  Constitutional: He is oriented to person, place, and time. He appears well-developed and well-nourished.  HENT:  Head: Normocephalic and atraumatic.  Right Ear: External ear normal.  Left Ear: External ear normal.  Nose: Nose normal.  Mouth/Throat: Oropharynx is clear and moist. No oropharyngeal exudate.  Eyes: Conjunctivae and EOM are normal. Pupils are equal, round, and reactive to light.  Neck: Normal range of motion. Neck supple.  Cardiovascular: Normal rate, regular rhythm, normal heart sounds and intact distal pulses.  Exam reveals no gallop and no friction rub.   No murmur heard. Pulmonary/Chest: Effort normal and breath sounds normal. No respiratory distress. He has no wheezes.  Abdominal: Soft. Bowel sounds are normal. He exhibits no distension. There is no tenderness. There is no rebound and no guarding.  Musculoskeletal: Normal range of motion. He exhibits no edema or tenderness.  Old healed lacerations to the volar aspect of forearms.  Neurological: He is alert and oriented to person, place, and time.  Skin: Skin is warm and dry.  Psychiatric: He has a normal mood and affect. His  behavior is normal.  Nursing note and vitals reviewed.   ED Course  Procedures (including critical care time) Labs Review Labs Reviewed  ACETAMINOPHEN LEVEL - Abnormal; Notable for the following:    Acetaminophen (Tylenol), Serum <10.0 (*)    All other  components within normal limits  COMPREHENSIVE METABOLIC PANEL - Abnormal; Notable for the following:    Glucose, Bld 100 (*)    All other components within normal limits  CBC  ETHANOL  SALICYLATE LEVEL  URINE RAPID DRUG SCREEN (HOSP PERFORMED)    Imaging Review No results found.   EKG Interpretation None      MDM   Final diagnoses:  Suicidal ideations    9:18 PM 24 y.o. male with a history of depression and suicide attempts in the past who presents with suicidal ideations. He states that he and his wife have been staying at a hotel. He states that she left to go to the front desk yesterday to get some toilet paper. He states that she did not return and he found out later that she has eloped. He has been suicidal since that time and having thoughts of cutting himself. He is afebrile and vital signs are unremarkable here. He denies any HI, or visual/auditory hallucinations. We'll get screening labs and TTS consult. He is here voluntarily.   I interpreted/reviewed the labs and/or imaging which were non-contributory.  [facility] Flavorsobos. Will transfer.  Purvis SheffieldForrest Sharica Roedel, MD 01/22/15 2219

## 2015-01-22 NOTE — ED Notes (Signed)
Staffing office contacted for sitter need.  Pt undressed and in scrubs and lab work drawn.   Chg notified of pts  Condition.  Security to wand the pt.  Mother at the bedside
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
## 2015-01-22 NOTE — ED Notes (Signed)
[facility]  Nursing report [phone]

## 2015-01-22 NOTE — BH Assessment (Signed)
Received notification of TTS consult request. Spoke with Dr. Purvis SheffieldForrest Harrison who said Pt has a history of cutting and suicide attempts. He reports suicidal ideation due to wife leaving him. Tele-assessment will be initiated.  Harlin RainFord Ellis Ria CommentWarrick Jr, LPC, [facility] Triage Specialist [phone]

## 2015-01-23 ENCOUNTER — Encounter (HOSPITAL_COMMUNITY): Payer: Self-pay | Admitting: *Deleted

## 2015-01-23 DIAGNOSIS — F332 Major depressive disorder, recurrent severe without psychotic features: Secondary | ICD-10-CM

## 2015-01-23 DIAGNOSIS — F339 Major depressive disorder, recurrent, unspecified: Secondary | ICD-10-CM | POA: Diagnosis present

## 2015-01-23 HISTORY — DX: Major depressive disorder, recurrent severe without psychotic features: F33.2

## 2015-01-23 MED ORDER — CLONAZEPAM 0.5 MG PO TABS
ORAL_TABLET | ORAL | Status: AC
  Filled 2015-01-23: qty 1

## 2015-01-23 MED ORDER — LORAZEPAM 0.5 MG PO TABS
ORAL_TABLET | ORAL | Status: AC
  Administered 2015-01-23: 01:00:00
  Filled 2015-01-23: qty 1

## 2015-01-23 MED ORDER — CLONAZEPAM 0.5 MG PO TABS
0.5000 mg | ORAL_TABLET | Freq: Three times a day (TID) | ORAL | Status: DC | PRN
  Administered 2015-01-23 – 2015-01-24 (×3): 0.5 mg via ORAL
  Filled 2015-01-23 (×2): qty 1

## 2015-01-23 MED ORDER — MAGNESIUM HYDROXIDE 400 MG/5ML PO SUSP: 30.0000 mL | Freq: Every day | ORAL | Status: DC | PRN

## 2015-01-23 MED ORDER — ACETAMINOPHEN 325 MG PO TABS
650.0000 mg | ORAL_TABLET | Freq: Four times a day (QID) | ORAL | Status: DC | PRN
Start: 2015-01-23 — End: 2015-01-24

## 2015-01-23 MED ORDER — CARBAMAZEPINE 200 MG PO TABS
200.0000 mg | ORAL_TABLET | Freq: Three times a day (TID) | ORAL | Status: DC
  Administered 2015-01-23 – 2015-01-24 (×3): 200 mg via ORAL
  Filled 2015-01-23 (×10): qty 1

## 2015-01-23 MED ORDER — LORAZEPAM 0.5 MG PO TABS
0.5000 mg | ORAL_TABLET | Freq: Once | ORAL | Status: AC
  Administered 2015-01-23: 0.5 mg via ORAL

## 2015-01-23 MED ORDER — ALUM & MAG HYDROXIDE-SIMETH 200-200-20 MG/5ML PO SUSP: 30.0000 mL | ORAL | Status: DC | PRN

## 2015-01-23 MED ORDER — CLONAZEPAM 0.5 MG PO TABS
0.5000 mg | ORAL_TABLET | Freq: Once | ORAL | Status: AC
  Administered 2015-01-23: 0.5 mg via ORAL
  Filled 2015-01-23: qty 1

## 2015-01-23 NOTE — [facility] Group Notes (Signed)
[facility] Group Notes:  (Nursing/[name]/Case Management/Adjunct)  Date:  01/23/2015  Time:  1045  Type of Therapy:  Nurse Education  Participation Level:  Did Not Attend  Participation Quality:  Did not attend  Affect:  Did not attend  Cognitive:  Did not attend  Insight:  None  Engagement in Group:  Did not attend  Modes of Intervention:  Discussion, Education and Support  Summary of Progress/Problems: Patient did not attend group and is consulting with NP, May at this time.  Lendell CapriceGuthrie, Alajiah Dutkiewicz A 01/23/2015, 11:02 AM
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
## 2015-01-23 NOTE — Progress Notes (Signed)
D: Patient is in bed resting this morning. When RN introduced self pt states "I don't want to talk, I just want to sleep." RN later returned to complete assessment. Pt responds minimally. Pt denies HI and AVH. Pt reports having suicidal thoughts today but refuses to elaborate on any details and states "I just want to sleep." Pt in dayroom at 0920 requesting to speak to the doctor and to be discharged. Pt states "I'm not suicidal, I was asleep when you asked me that, I don't even remember talking to you, will you tell the doctor that? I need to leave today, I have to get my stuff out of my apartment by Monday. I'm here because I was feeling depressed and I'm having problems with my wife and they kept me here. I've got 6 kids I can't be suicidal.  I need to turn myself into my probation officer." Pt states "Is the doctor going to discharge me? Tell him I'm not compliant with anything and I don't want fu**ing treatment." Pt paces the halls at times. Pt later returned to RN to apologize for his actions earlier. Pt complains of anxiety with no relief from PRN medication, pt requesting increase in dose. Pt is attending groups. A: Active listening by RN. Support/Encouragement provided to pt. Pt offered 72 hour request for discharge form and pt refused. NP May and MD Tawni CarnesSaranga made aware of pt's concerns and desire to be discharged. Np, May made aware that pt is requesting increased anxiety medication. PRN medication administered for anxiety per providers orders (See MAR). Scheduled medications administered per providers orders (See MAR). 15 minute checks continued per protocol for patient safety.  R: Pt is able to verbally contract for safety and agrees to come to staff with increased intensity of suicidal thoughts. Pt remains safe.

## 2015-01-23 NOTE — [facility] Suicide Risk Assessment (Signed)
[facility]   Nursing information obtained from:    Demographic factors:    Current Mental Status:    Loss Factors:    Historical Factors:    Risk Reduction Factors:    Total Time spent with patient: 30 minutes Principal Problem: Major depressive disorder, recurrent episode, severe Diagnosis:   Patient Active Problem List   Diagnosis Date Noted  . Major depressive disorder, recurrent episode, severe [F33.2] 01/23/2015    Class: Acute  . Major depressive disorder, recurrent [F33.9] 01/23/2015  . Severe major depression without psychotic features [F32.2] 08/27/2014  . Ankle contusion [S90.00XA] 08/24/2014  . Adjustment disorder [F43.20] 08/24/2014  . Polysubstance (excluding opioids) dependence without physiological dependence [F19.20] 09/22/2013  . 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 Suicide threat or attempt [F48.9] 09/22/2013  . PTSD (post-traumatic stress disorder) [F43.10] 09/22/2013  . Suicidal ideation [R45.851] 09/22/2013  . Major depressive disorder, recurrent episode, severe, specified as with psychotic behavior [F33.3] 09/17/2013  . Polysubstance dependence [F19.20] 09/17/2013     Continued Clinical Symptoms:  Alcohol Use Disorder Identification Test Final Score (AUDIT): 0 The "Alcohol Use Disorders Identification Test", Guidelines for Use in Primary Care, Second Edition.  World Science writerHealth Organization [facility](WHO). Score between 0-7:  no or low risk or alcohol related problems. Score between 8-15:  moderate risk of alcohol related problems. Score between 16-19:  high risk of alcohol related problems. Score 20 or above:  warrants further diagnostic evaluation for alcohol dependence and treatment.   CLINICAL FACTORS:   Severe Anxiety and/or Agitation Depression:   Aggression   Musculoskeletal: Strength & Muscle Tone: within normal limits Gait & Station: normal Patient leans: N/A  Psychiatric  Specialty Exam: Physical Exam  ROS  Blood pressure 119/81, pulse 89, temperature 98.4 F (36.9 C), temperature source Oral, resp. rate 18, height 5\' 8"  (1.727 m), weight 68.04 kg (150 lb).Body mass index is 22.81 kg/(m^2).  General Appearance: Casual and Guarded  Eye Contact::  Fair  Speech:  Normal Rate  Volume:  Normal  Mood:  Anxious and Irritable  Affect:  Appropriate, Congruent and Labile  Thought Process:  Goal Directed  Orientation:  Full (Time, Place, and Person)  Thought Content:  Negative  Suicidal Thoughts:  Yes.  without intent/plan  Homicidal Thoughts:  No  Memory:  Negative  Judgement:  Impaired  Insight:  Shallow  Psychomotor Activity:  Restlessness  Concentration:  Fair  Recall:  Fair  Fund of Knowledge:Fair  Language: Fair  Akathisia:  Negative  Handed:  Right  AIMS (if indicated):     Assets:  Communication Skills Desire for Improvement  Sleep:  Number of Hours: 4.75  Cognition: WNL  ADL's:  Intact     COGNITIVE FEATURES THAT CONTRIBUTE TO RISK:  Closed-mindedness, Loss of executive function and Thought constriction (tunnel vision)    SUICIDE RISK:   Moderate:  Frequent suicidal ideation with limited intensity, and duration, some specificity in terms of plans, no associated intent, good self-control, limited dysphoria/symptomatology, some risk factors present, and identifiable protective factors, including available and accessible social support.  PLAN OF CARE: will start tegretol for mood stabilization, and will need to check level. Will order klonopin 0.5 mg tid prn anxiety.    Medical Decision Making:  New problem, with additional work up planned, Review of Psycho-Social Stressors (1) and Review of Medication Regimen & Side Effects (2)  I certify that inpatient services furnished can reasonably be expected to improve the patient's condition.  Ancil Linsey 01/23/2015, 5:40 PM

## 2015-01-23 NOTE — Progress Notes (Signed)
Patient seen pacing the hallway and using the phone at interval. Denies pain, si, ah/vh. Conversation is minimal. Responds only to YES or NO answers. Patient walked away from the writer and went in his room. Every 15 minutes check maintained for safety. Will continue to monitor patient.

## 2015-01-23 NOTE — [facility] Group Notes (Signed)
[facility] Group Notes: (Clinical Social Work)   01/23/2015      Type of Therapy:  Group Therapy   Participation Level:  Did Not Attend despite [name] prompting   Ambrose MantleMareida Grossman-Orr, LCSW 01/23/2015, 5:04 PM

## 2015-01-23 NOTE — Tx Team (Signed)
Initial Interdisciplinary Treatment Plan   PATIENT STRESSORS: Loss of wife Marital or family conflict Medication change or noncompliance Traumatic event   PATIENT STRENGTHS: Average or above average intelligence Motivation for treatment/growth   PROBLEM LIST: Problem List/Patient Goals Date to be addressed Date deferred Reason deferred Estimated date of resolution  Depression 02/23/15     Suicidal ideation 02/23/15     "I need help"                                           DISCHARGE CRITERIA:  Adequate post-discharge living arrangements Improved stabilization in mood, thinking, and/or behavior Motivation to continue treatment in a less acute level of care Need for constant or close observation no longer present  PRELIMINARY DISCHARGE PLAN: Outpatient therapy Placement in alternative living arrangements  PATIENT/FAMIILY INVOLVEMENT: This treatment plan has been presented to and reviewed with the patient, Inez PilgrimBrandon G Cast, and/or family member.  The patient and family have been given the opportunity to ask questions and make suggestions.  Juliann ParesBowman, Vernette Moise Elizabeth 01/23/2015, 12:48 AM

## 2015-01-23 NOTE — Plan of Care (Signed)
Problem: Ineffective individual coping Goal: STG: Patient will remain free from self harm Outcome: Progressing Patient remains free from self harm. 15 minute checks continued per protocol for patient safety.   Problem: Diagnosis: Increased Risk For Suicide Attempt Goal: STG-Patient Will Attend All Groups On The Unit Outcome: Progressing Patient is attending unit groups. Goal: STG-Patient Will Comply With Medication Regime Outcome: Progressing Patient has adhered to medication regimen today.

## 2015-01-23 NOTE — Progress Notes (Signed)
Psychoeducational Group Note  Date:  01/23/2015 Time:  2129  Group Topic/Focus:  Wrap-Up Group:   The focus of this group is to help patients review their daily goal of treatment and discuss progress on daily workbooks.  Participation Level: Did Not Attend  Participation Quality:  Not Applicable  Affect:  Not Applicable  Cognitive:  Not Applicable  Insight:  Not Applicable  Engagement in Group: Not Applicable  Additional Comments:  The patient did not attend group this evening and returned to sleep.   Hazle CocaGOODMAN, Elly Haffey S 01/23/2015, 9:29 PM
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
## 2015-01-24 MED ORDER — CARBAMAZEPINE 200 MG PO TABS: 200.0000 mg | ORAL_TABLET | Freq: Three times a day (TID) | ORAL | Status: DC

## 2015-01-24 MED ORDER — CLONAZEPAM 1 MG PO TABS
1.0000 mg | ORAL_TABLET | Freq: Once | ORAL | Status: AC
  Administered 2015-01-24: 1 mg via ORAL
  Filled 2015-01-24: qty 1

## 2015-01-24 NOTE — Discharge Instructions (Signed)
Please obtain Tegretol level, CMP and CBC with diff in a week.

## 2015-01-24 NOTE — Progress Notes (Addendum)
Pt refused morning VS, nurse notified

## 2015-01-24 NOTE — [facility] Group Notes (Signed)
[facility] Group Notes:  (Nursing/[name]/Case Management/Adjunct)  Date:  01/24/2015  Time:  0930am  Type of Therapy:  Nurse Education  Participation Level:  Did Not Attend  Participation Quality:  Did not attend  Affect:  Did not attend  Cognitive:  Did not attend  Insight:  None  Engagement in Group:  Did not attend  Modes of Intervention:  Discussion, Education and Support  Summary of Progress/Problems: Patient was invited to group. Pt did not attend group and remained in bed resting.  Lendell CapriceGuthrie, Marlon Vonruden A 01/24/2015, 11:01 AM

## 2015-01-24 NOTE — [facility] Suicide Risk Assessment (Signed)
[facility]   Demographic Factors:  Male, Adolescent or young adult and Caucasian  Total Time spent with patient: 30 minutes  Musculoskeletal: Strength & Muscle Tone: within normal limits Gait & Station: normal Patient leans: N/A  Psychiatric Specialty Exam: Physical Exam  ROS  Blood pressure 119/81, pulse 89, temperature 98.4 F (36.9 C), temperature source Oral, resp. rate 18, height 5\' 8"  (1.727 m), weight 68.04 kg (150 lb).Body mass index is 22.81 kg/(m^2).  General Appearance: Casual, Fairly Groomed and Neat  Eye Contact::  Fair  Speech:  Clear and Coherent and Normal Rate409  Volume:  Normal  Mood:  Euthymic  Affect:  Appropriate, Congruent and Full Range  Thought Process:  Coherent, Goal Directed and Logical  Orientation:  Full (Time, Place, and Person)  Thought Content:  Negative  Suicidal Thoughts:  No  Homicidal Thoughts:  No  Memory:  Negative  Judgement:  Fair  Insight:  Fair  Psychomotor Activity:  Normal  Concentration:  Fair  Recall:  Fair  Fund of Knowledge:Fair  Language: Fair  Akathisia:  Negative  Handed:  Right  AIMS (if indicated):     Assets:  Communication Skills Desire for Improvement Financial Resources/Insurance Housing Intimacy Leisure Time Physical Health Resilience Social Support Talents/Skills Transportation Vocational/Educational  Sleep:  Number of Hours: 6.75  Cognition: WNL  ADL's:  Intact   Have you used any form of tobacco in the last 30 days? (Cigarettes, Smokeless Tobacco, Cigars, and/or Pipes): Yes  Has this patient used any form of tobacco in the last 30 days? (Cigarettes, Smokeless Tobacco, Cigars, and/or Pipes) Yes, A prescription for an FDA-approved tobacco cessation medication was offered at discharge and the patient refused  Mental Status Per Nursing Assessment::   On Admission:     Current Mental Status by Physician: Pt reports feeling better today. Mood is better. Pt felt klonopin  helped for anxiety. Tolerating tegretol well. Pt denies SI/HI/AVH. Pt reports readiness for discharge. Pt reports his mother will pick him up from the hospital, and take him to his hotel room. Pt wants to work on his relationship with his wife/girlfriend. Pt reports currently being on probation, needing to serve jail time, and also has an upcoming court date.  Loss Factors: Legal issues  Historical Factors: Prior suicide attempts and Impulsivity  Risk Reduction Factors:   Sense of responsibility to family, Positive social support and Positive therapeutic relationship  Continued Clinical Symptoms:  Bipolar Disorder:   Mixed State  Cognitive Features That Contribute To Risk:  Closed-mindedness, Loss of executive function, Polarized thinking and Thought constriction (tunnel vision)    Suicide Risk:  Mild:  Suicidal ideation of limited frequency, intensity, duration, and specificity.  There are no identifiable plans, no associated intent, mild dysphoria and related symptoms, good self-control (both objective and subjective assessment), few other risk factors, and identifiable protective factors, including available and accessible social support.  Principal Problem: Major depressive disorder, recurrent episode, severe Discharge Diagnoses:  Patient Active Problem List   Diagnosis Date Noted  . Major depressive disorder, recurrent episode, severe [F33.2] 01/23/2015    Class: Acute  . Major depressive disorder, recurrent [F33.9] 01/23/2015  . Severe major depression without psychotic features [F32.2] 08/27/2014  . Ankle contusion [S90.00XA] 08/24/2014  . Adjustment disorder [F43.20] 08/24/2014  . Polysubstance (excluding opioids) dependence without physiological dependence [F19.20] 09/22/2013  . 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 Suicide threat or attempt [F48.9] 09/22/2013  . PTSD (post-traumatic stress disorder) [F43.10]  09/22/2013  .  Suicidal ideation [R45.851] 09/22/2013  . Major depressive disorder, recurrent episode, severe, specified as with psychotic behavior [F33.3] 09/17/2013  . Polysubstance dependence [F19.20] 09/17/2013      Plan Of Care/Follow-up recommendations:  Activity:  as tolerated Diet:  regular Tests:  tegretol level in 5 days Other:  follow up with outpatient psychiatrist, therapist, and probation officer.  Is patient on multiple antipsychotic therapies at discharge:  No   Has Patient had three or more failed trials of antipsychotic monotherapy by history:  No  Recommended Plan for Multiple Antipsychotic Therapies: NA    Ezzie Senat 01/24/2015, 12:50 PM

## 2015-01-24 NOTE — Progress Notes (Signed)
  [facility] Dis[facility] :  Will you be returning to the same living situation after discharge:  Yes,  states will go back to hotel for a couple of days then turn himself in to PO At discharge, do you have transportation home?: Yes,  Mother picking up Do you have the ability to pay for your medications: No.  Pt stated in assessment he would not pay for meds.  At D/C he stated he wants to stay on his meds, and [facility].  Release of information consent forms completed and in the chart;  Patient's signature needed at discharge.  Patient to Follow up at: Follow-up Information    Follow up with [facility] In 2 days.   Why[facility]  [facility] 8[facility]30[facility]-3:00pm.  You will have to see a therapist one time for an assessment, then will see the doctor.  You will not be obligated to go through therapy.   Contact information:   [facility] [address] [place]  [id number]  Telephone:  [phone]      Patient denies SI/HI: Yes,  see Dr. Suicide Risk Assessment at D/C note    Safety Planning and Suicide Prevention discussed: Yes,  with pt - refused to sign consent for collateral SPE  Have you used any form of tobacco in the last 30 days? (Cigarettes, Smokeless Tobacco, Cigars, and/or Pipes): Yes  Has patient been referred to the Quitline?: Patient refused referral  Sarina SerGrossman-Orr, Mayes Sangiovanni Jo 01/24/2015, 3:04 PM

## 2015-01-24 NOTE — [facility] Suicide Risk Assessment (Signed)
[facility] INPATIENT:  Family/Significant Other Suicide Prevention Education  Suicide Prevention Education:  Patient Refusal for Family/Significant Other Suicide Prevention Education: The patient [name] has refused to provide written consent for family/significant other to be provided Family/Significant Other Suicide Prevention Education during admission and/or prior to discharge.  Physician notified.  SPE was provided to pt along with SPE brochure.  Pt commented that he knows all of the information already.  He stated his mother already knows all the information and CSW does not need to contact her.  Sarina SerGrossman-Orr, [name] 01/24/2015, 3:01 PM

## 2015-01-24 NOTE — Progress Notes (Signed)
DISCHARGE NOTE: D: Patient was alert, oriented, in stable condition, and ambulatory with a steady gait upon discharge. Pt denies SI/HI and AVH.  A: AVS reviewed and given to pt. Follow up reviewed with pt. Resources reviewed with pt, including [facility]. Prescriptions/Medications given to pt. Belongings returned to pt. Pt given time to ask questions and express concerns. Pt encouraged to follow up with PCP for blood work follow up. R: Pt D/C'd to mother.

## 2015-01-24 NOTE — Progress Notes (Signed)
D: Patient refuses to get out of bed this morning. Pt refused 0800 medication administration this morning. Pt irritable, child-like, and animated at times. Pt refuses to have vitals obtained. Attempted to obtain vital signs and administer 0800 medication x3, pt continues to refuse. Pt began taking scheduled medication around 1100am. Pt complains of anxiety this morning. Pt denies SI/HI and AVH. Pt responds minimally to RN throughout the day. Pt pacing the halls at times. Pt requesting discharge today. Pt is not attending groups. A: NP, May made aware of pt's request. Active listening by RN. Encouragement/Support provided to pt. PRN medication administered for anxiety per providers orders (See MAR). 15 minute checks continued per protocol for patient safety.  R: Pt non-adherent to group schedule. Pt not cooperative at times. Pt remains safe.

## 2015-01-25 ENCOUNTER — Emergency Department (HOSPITAL_COMMUNITY)
Admission: EM | Admit: 2015-01-25 | Discharge: 2015-01-26 | Disposition: A | Discharge status: Home / Self Care | Payer: No Typology Code available for payment source | Attending: Emergency Medicine | Admitting: Emergency Medicine

## 2015-01-25 ENCOUNTER — Encounter (HOSPITAL_COMMUNITY): Payer: Self-pay | Admitting: *Deleted

## 2015-01-25 DIAGNOSIS — F339 Major depressive disorder, recurrent, unspecified: Secondary | ICD-10-CM | POA: Diagnosis present

## 2015-01-25 DIAGNOSIS — F419 Anxiety disorder, unspecified: Secondary | ICD-10-CM | POA: Insufficient documentation

## 2015-01-25 DIAGNOSIS — F329 Major depressive disorder, single episode, unspecified: Secondary | ICD-10-CM | POA: Insufficient documentation

## 2015-01-25 DIAGNOSIS — F32A Depression, unspecified: Secondary | ICD-10-CM

## 2015-01-25 DIAGNOSIS — Z720 Tobacco use: Secondary | ICD-10-CM | POA: Insufficient documentation

## 2015-01-25 DIAGNOSIS — F1210 Cannabis abuse, uncomplicated: Secondary | ICD-10-CM | POA: Insufficient documentation

## 2015-01-25 LAB — CBC
HEMATOCRIT: 41.4 % (ref 39.0–52.0)
Hemoglobin: 14.7 g/dL (ref 13.0–17.0)
MCH: 30.2 pg (ref 26.0–34.0)
MCHC: 35.5 g/dL (ref 30.0–36.0)
MCV: 85 fL (ref 78.0–100.0)
Platelets: 338 10*3/uL (ref 150–400)
RBC: 4.87 MIL/uL (ref 4.22–5.81)
RDW: 13 % (ref 11.5–15.5)
WBC: 8.5 10*3/uL (ref 4.0–10.5)
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
## 2015-01-25 NOTE — ED Notes (Signed)
Patient states he lost all his meds  States he knows where he left them but was scared to go back to get them

## 2015-01-25 NOTE — ED Notes (Signed)
Patient presents stating he is suicidal.  Left [facility] yesterday and was smoking pot today and now has become suicidal

## 2015-01-25 NOTE — ED Notes (Signed)
Staffing and Consulting civil engineerCharge RN notified.

## 2015-01-25 NOTE — ED Notes (Signed)
Bed control called for sitter by charge nurse.  Patient changed into scrubs and security here to wand patient.
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
## 2015-01-25 NOTE — ED Notes (Signed)
[facility]

## 2015-01-26 DIAGNOSIS — F331 Major depressive disorder, recurrent, moderate: Secondary | ICD-10-CM

## 2015-01-26 LAB — SALICYLATE LEVEL: Salicylate Lvl: <4 mg/dL (ref 2.8–20.0)

## 2015-01-26 LAB — RAPID URINE DRUG SCREEN, HOSP PERFORMED
Amphetamines: NOT DETECTED
Barbiturates: NOT DETECTED
Benzodiazepines: NOT DETECTED
Cocaine: NOT DETECTED
OPIATES: NOT DETECTED
TETRAHYDROCANNABINOL: POSITIVE — AB

## 2015-01-26 LAB — COMPREHENSIVE METABOLIC PANEL
ALBUMIN: 4.8 g/dL (ref 3.5–5.2)
ALT: 12 U/L (ref 0–53)
AST: 24 U/L (ref 0–37)
Alkaline Phosphatase: 62 U/L (ref 39–117)
Anion gap: 10 (ref 5–15)
BUN: 9 mg/dL (ref 6–23)
CHLORIDE: 105 mmol/L (ref 96–112)
CO2: 26 mmol/L (ref 19–32)
CREATININE: 1.04 mg/dL (ref 0.50–1.35)
Calcium: 9.7 mg/dL (ref 8.4–10.5)
GFR calc Af Amer: >90 mL/min (ref >90)
GFR calc non Af Amer: >90 mL/min (ref >90)
Glucose, Bld: 101 mg/dL — ABNORMAL HIGH (ref 70–99)
Potassium: 3.8 mmol/L (ref 3.5–5.1)
Sodium: 141 mmol/L (ref 135–145)
TOTAL PROTEIN: 7.5 g/dL (ref 6.0–8.3)
Total Bilirubin: 0.4 mg/dL (ref 0.3–1.2)

## 2015-01-26 LAB — ACETAMINOPHEN LEVEL: Acetaminophen (Tylenol), Serum: <10 ug/mL — ABNORMAL LOW (ref 10–30)

## 2015-01-26 LAB — ETHANOL

## 2015-01-26 MED ORDER — CLONAZEPAM 0.5 MG PO TABS
0.5000 mg | ORAL_TABLET | Freq: Once | ORAL | Status: AC
  Administered 2015-01-26: 0.5 mg via ORAL
  Filled 2015-01-26: qty 1

## 2015-01-26 MED ORDER — CLONAZEPAM 0.5 MG PO TABS: 0.5000 mg | ORAL_TABLET | Freq: Three times a day (TID) | ORAL | Status: DC | PRN

## 2015-01-26 MED ORDER — CARBAMAZEPINE 200 MG PO TABS
200.0000 mg | ORAL_TABLET | Freq: Three times a day (TID) | ORAL | Status: DC
  Administered 2015-01-26: 200 mg via ORAL
  Filled 2015-01-26: qty 1

## 2015-01-26 NOTE — ED Notes (Signed)
Dr Rhunette Croftnanavati in to evaluate patient

## 2015-01-26 NOTE — ED Notes (Signed)
All belongings and valuables returned to patient.

## 2015-01-26 NOTE — Discharge Instructions (Signed)
Depression Depression refers to feeling sad, low, down in the dumps, blue, gloomy, or empty. In general, there are two kinds of depression:  Normal sadness or normal grief. This kind of depression is one that we all feel from time to time after upsetting life experiences, such as the loss of a job or the ending of a relationship. This kind of depression is considered normal, is short lived, and resolves within a few days to 2 weeks. Depression experienced after the loss of a loved one (bereavement) often lasts longer than 2 weeks but normally gets better with time.  Clinical depression. This kind of depression lasts longer than normal sadness or normal grief or interferes with your ability to function at home, at work, and in school. It also interferes with your personal relationships. It affects almost every aspect of your life. Clinical depression is an illness. Symptoms of depression can also be caused by conditions other than those mentioned above, such as:  Physical illness. Some physical illnesses, including underactive thyroid gland (hypothyroidism), severe anemia, specific types of cancer, diabetes, uncontrolled seizures, heart and lung problems, strokes, and chronic pain are commonly associated with symptoms of depression.  Side effects of some prescription medicine. In some people, certain types of medicine can cause symptoms of depression.  Substance abuse. Abuse of alcohol and illicit drugs can cause symptoms of depression. SYMPTOMS Symptoms of normal sadness and normal grief include the following:  Feeling sad or crying for short periods of time.  Not caring about anything (apathy).  Difficulty sleeping or sleeping too much.  No longer able to enjoy the things you used to enjoy.  Desire to be by oneself all the time (social isolation).  Lack of energy or motivation.  Difficulty concentrating or remembering.  Change in appetite or weight.  Restlessness or  agitation. Symptoms of clinical depression include the same symptoms of normal sadness or normal grief and also the following symptoms:  Feeling sad or crying all the time.  Feelings of guilt or worthlessness.  Feelings of hopelessness or helplessness.  Thoughts of suicide or the desire to harm yourself (suicidal ideation).  Loss of touch with reality (psychotic symptoms). Seeing or hearing things that are not real (hallucinations) or having false beliefs about your life or the people around you (delusions and paranoia). DIAGNOSIS  The diagnosis of clinical depression is usually based on how bad the symptoms are and how long they have lasted. Your health care provider will also ask you questions about your medical history and substance use to find out if physical illness, use of prescription medicine, or substance abuse is causing your depression. Your health care provider may also order blood tests. TREATMENT  Often, normal sadness and normal grief do not require treatment. However, sometimes antidepressant medicine is given for bereavement to ease the depressive symptoms until they resolve. The treatment for clinical depression depends on how bad the symptoms are but often includes antidepressant medicine, counseling with a mental health professional, or both. Your health care provider will help to determine what treatment is best for you. Depression caused by physical illness usually goes away with appropriate medical treatment of the illness. If prescription medicine is causing depression, talk with your health care provider about stopping the medicine, decreasing the dose, or changing to another medicine. Depression caused by the abuse of alcohol or illicit drugs goes away when you stop using these substances. Some adults need professional help in order to stop drinking or using drugs. SEEK IMMEDIATE MEDICAL  CARE IF:  You have thoughts about hurting yourself or others.  You lose touch  with reality (have psychotic symptoms).  You are taking medicine for depression and have a serious side effect. FOR MORE INFORMATION  National Alliance on Mental Illness: www.nami.[employer] of Mental Health: http://www.maynard.net/ Document Released: 11/03/2000 Document Revised: 03/23/2014 Document Reviewed: 02/05/2012 [facility] Patient Information 2015 [place], [place]. This information is not intended to replace advice given to you by your health care provider. Make sure you discuss any questions you have with your health care provider. Generalized Anxiety Disorder Generalized anxiety disorder (GAD) is a mental disorder. It interferes with life functions, including relationships, work, and school. GAD is different from normal anxiety, which everyone experiences at some point in their lives in response to specific life events and activities. Normal anxiety actually helps [place] prepare for and get through these life events and activities. Normal anxiety goes away after the event or activity is over.  GAD causes anxiety that is not necessarily related to specific events or activities. It also causes excess anxiety in proportion to specific events or activities. The anxiety associated with GAD is also difficult to control. GAD can vary from mild to severe. People with severe GAD can have intense waves of anxiety with physical symptoms (panic attacks).  SYMPTOMS The anxiety and worry associated with GAD are difficult to control. This anxiety and worry are related to many life events and activities and also occur more days than not for 6 months or longer. People with GAD also have three or more of the following symptoms (one or more in children):  Restlessness.   Fatigue.  Difficulty concentrating.   Irritability.  Muscle tension.  Difficulty sleeping or unsatisfying sleep. DIAGNOSIS GAD is diagnosed through an assessment by your health care provider. Your health care provider will ask  you questions aboutyour mood,physical symptoms, and events in your life. Your health care provider may ask you about your medical history and use of alcohol or drugs, including prescription medicines. Your health care provider may also do a physical exam and blood tests. Certain medical conditions and the use of certain substances can cause symptoms similar to those associated with GAD. Your health care provider may refer you to a mental health specialist for further evaluation. TREATMENT The following therapies are usually used to treat GAD:   Medication. Antidepressant medication usually is prescribed for long-term daily control. Antianxiety medicines may be added in severe cases, especially when panic attacks occur.   Talk therapy (psychotherapy). Certain types of talk therapy can be helpful in treating GAD by providing support, education, and guidance. A form of talk therapy called cognitive behavioral therapy can teach you healthy ways to think about and react to daily life events and activities.  Stress managementtechniques. These include yoga, meditation, and exercise and can be very helpful when they are practiced regularly. A mental health specialist can help determine which treatment is best for you. Some people see improvement with one therapy. However, other people require a combination of therapies. Document Released: 03/03/2013 Document Revised: 03/23/2014 Document Reviewed: 03/03/2013 [facility] Patient Information 2015 [place], [place]. This information is not intended to replace advice given to you by your health care provider. Make sure you discuss any questions you have with your health care provider.    Emergency Department Resource Guide 1) Find a Doctor and Pay Out of Pocket Although you won't have to find out who is covered by your insurance plan, it is a good idea  to ask around and get recommendations. You will then need to call the office and see if the doctor you have  chosen will accept you as a new patient and what types of options they offer for patients who are self-pay. Some doctors offer discounts or will set up payment plans for their patients who do not have insurance, but you will need to ask so you aren't surprised when you get to your appointment.  2) Contact Your Local Health Department Not all health departments have doctors that can see patients for sick visits, but many do, so it is worth a call to see if yours does. If you don't know where your local health department is, you can check in your phone book. The CDC also has a tool to help you locate your state's health department, and many state websites also have listings of all of their local health departments.  3) Find a Walk-in Clinic If your illness is not likely to be very severe or complicated, you may want to try a walk in clinic. These are popping up all over the country in pharmacies, drugstores, and shopping centers. They're usually staffed by nurse practitioners or physician assistants that have been trained to treat common illnesses and complaints. They're usually fairly quick and inexpensive. However, if you have serious medical issues or chronic medical problems, these are probably not your best option.  No Primary Care Doctor: - Call Health Connect at  [phone] - they can help you locate a primary care doctor that  accepts your insurance, provides certain services, etc. - Physician Referral Service- [phone]  Chronic Pain Problems: Organization         Address  Phone   Notes  [facility]  [phone] Patients need to be referred by their primary care doctor.   Medication Assistance: Organization         Address  Phone   Notes  [facility] Medication [facility] [address] [place] [place], [name] [address] [phone] --Must be a resident of [facility] -- Must have NO insurance coverage whatsoever (no Medicaid/ Medicare,  etc.) -- The pt. MUST have a primary care doctor that directs their care regularly and follows them in the community   Med[facility]ist  [phone]   [employer]  [phone]    Agencies that provide inexpensive medical care: Organization         Address  Phone   Notes  [facility]  [phone]   [facility]    [phone]   [facility] [address] [place], [name] [address] [phone]   [facility] [address] [place], [place] [phone]   Planned Parenthood    [phone]   [facility]    [phone]   [facility] and [facility]  [address], [place] Phone:  [phone], Fax:  [phone] Hours of Operation:  9 am - 6 pm, M-F.  Also accepts Medicaid/Medicare and self-pay.  [facility]  [address], [place] Phone: [phone], Fax: [phone]. Hours of Operation:  8:30 am - 5:30 pm, M-F.  Also accepts Medicaid and self-pay.  [facility] [place] [address] [place] Phone: [phone]   [facility] [address] Natasha Bence [place], [name] [phone], Ext. [phone] Mondays & Thursdays: 7-9 AM.  First 15 patients  are seen on a first come, first serve basis.    Medicaid-accepting [facility] Providers:  Organization         Address  Phone   Notes  [facility] [address],  [phone] Also accepts self-pay patients.  [facility] [address] Laurell Josephs [place], [place]  [phone]   [facility] [address], [place] [phone]   [facility] [address] [place] [phone]   Renaye Rakers [address], [place]   [phone] Only accepts [place] Access [place] patients after they have their name applied to their card.   Self-Pay (no  insurance) in [facility]:  Organization         Address  Phone   Notes  Sickle Cell Patients, [facility] [address] [place], [place] [phone]   [facility] [address] [place], [place] [phone]   [facility] [place]  [address], [place] [phone]   [facility]/Dr. Osei-Bonsu  [address] [place] or [address], [place] [phone] Phone number for both [place] and [place] locations is the same.  [facility] [address], [place] [phone]   [facility] [address] [place] or [address] [phone]   [facility] [place] [facility] [address] [place] [phone], phone; [phone], fax Sees patients 1st and 3rd Saturday of every month.  Must not qualify for public or private insurance (i.e. Medicaid, Medicare, [place] Health Choice, Veterans' Benefits)  Household income should be no more than 200% of the poverty level The clinic cannot treat you if you are pregnant or think you are pregnant  Sexually transmitted diseases are not treated at the clinic.    Dental Care: Organization         Address  Phone  Notes  [facility] Department of [facility] [address] [employer] [employer], [place] [phone] Accepts children up to age 93 who are enrolled in [place] or [place] Health Choice; pregnant women with a Medicaid card; and children who have applied for Medicaid or [place] Health Choice, but were declined, whose parents can pay a reduced fee at time of service.  [facility] Department of [facility]  [address], [place] [phone] Accepts children up to age 46 who are enrolled in [place] or [place] Health Choice; pregnant women with a Medicaid card; and children who have applied for Medicaid or [place] Health Choice, but were  declined, whose parents can pay a reduced fee at time of service.  [facility]  [address] [place], [place] [phone] Patients are seen by appointment only. Walk-ins are not accepted. [facility] will see patients 62 years of age and older. Monday - Tuesday (8am-5pm) Most Wednesdays (8:30-5pm) $30 per visit, cash only  [facility]  [address], [facility] [phone] Patients are seen by appointment only. Walk-ins are not accepted. [facility] will see patients 33 years of age and older. One Wednesday Evening (Monthly: Volunteer Based).  $30 per visit, cash only  [employer]  [phone] for adults; Children under age 34, call Graduate Pediatric Dentistry at (  [phone]. Children aged 65-14, please call [phone] to request a pediatric application.  Dental services are provided in all areas of dental care including fillings, crowns and bridges, complete and partial dentures, implants, gum treatment, root canals, and extractions. Preventive care is also provided. Treatment is provided to both adults and children. Patients are selected via a lottery and there is often a waiting list.   [facility] [address] [place]  [phone] [email or web address]   [facility] [address] [place], [name] [phone], Ext. [phone] Second and Fourth Thursday of each month, opens at 6:30 AM; Clinic ends at 9 AM.  Patients are seen on a first-come first-served basis, and a limited number are seen during each clinic.   [facility]  [address] Ether Griffins [place], [name] [phone]   Eligibility Requirements You must have lived in [place], [place], or [place] counties for at least the last three months.   You cannot be eligible for state or federal sponsored [place], including [employer], [place], or [employer].   You generally cannot be  eligible for healthcare insurance through your employer.    How to apply: Eligibility screenings are held every Tuesday and Wednesday afternoon from 1:00 pm until 4:00 pm. You do not need an appointment for the interview!  [facility] [address] [place], [name] [phone]   [facility] Health Department  [phone]   [facility] Health Department  [phone]   [facility] Department  [phone]    Behavioral Health Resources in the Community: Intensive Outpatient Programs Organization         Address  Phone  Notes  [facility] [address], [place], [name] [phone]   [facility] Outpatient [address] [place], [name] [phone]   [facility] [address], [place], [name]  [phone]   [facility] [address]  [place], [name] [phone] or [phone]   Substance Abuse Resources Organization         Address  Phone  Notes  [facility]  [phone]   [facility]  [phone]   [facility]  [phone]   Floydene Flock  [phone]   Residential & Outpatient Substance Abuse Program  [phone]   Psychological Services Organization         Address  Phone  Notes  [facility] Behavioral Health  [phone]   [facility] Services  [phone]   [facility] Mental Health [address] [place] [phone] or [phone]    Mobile Crisis Teams Organization         Address  Phone  Notes  [facility], Mobile Crisis Care Unit  [phone]   [facility]  [address] [place], [name] [phone]   Doristine Locks [address] [name] [phone]    Self-Help/Support Groups Organization         Address  Phone             Notes  Mental Health Assoc. of  - variety of support groups  336- I7437963 Call for more information   Narcotics Anonymous (NA), [facility] [address], [employer] [place]  2 meetings at this location   Chief Executive Officer  Notes  [facility] [address],    [name]  [phone]   [facility]  [address] [place] [address], [place], [name] [phone]   [facility] [address] [place], [place] [phone] Admissions: 8am-3pm M-F  [facility] [address],    [place], [name] [phone]   [facility] [address] [place], [place], [name] [phone]   [facility] [address],  [place], [name] [phone]   [facility] - Intensive Outpatient [address] Laurell Josephs [address], [place], [name] [phone]   [facility] ([facility] [address] [place]  [place], [name] [phone] or [phone]   [facility] ([facility]) [address], [place], [name] [phone] Accepts Medicaid  [facility] [address]  [name] [phone] Substance Abuse/Addiction Treatment   [facility] Organization         Address  Phone  Notes  [facility]  [phone]   Angie Fava, PhD [address] Ervin Knack [place], [name]   [phone] or [phone]   [facility] Behavioral   [address] [place], [name] [phone]   [facility] [address], [place], [name] [phone] Insurance/Medicaid/sponsorship through [facility] [address]                                    [place], [name] [phone] Therapy/tele-psych/case  [facility] [address], [name] [phone]    Dr. Lolly Mustache  [phone]   [facility]  [employer] [facility] Dept. 1) [address] [place] 2) [address], [place] 3)  [address], [place] [phone]  [phone]   [facility] Child Abuse Hotline [phone] or [phone] (After Hours)

## 2015-01-26 NOTE — ED Notes (Signed)
Telepsych set up in room 
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
## 2015-01-26 NOTE — ED Notes (Signed)
Pt pacing in the hall. He has asked numerous times if we have called the police because "i lied when i came in and told them i was suicidal. i just wanted to get some klonopins

## 2015-01-26 NOTE — ED Notes (Signed)
Psych reeval completed 

## 2015-01-26 NOTE — ED Notes (Signed)
Psych reeval in progress with conrad
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
## 2015-01-26 NOTE — ED Notes (Signed)
Spoke to Computer Sciences Corporationconrad about pt disposition. States he is looking for a md to present pt to so he can dispo him

## 2015-01-26 NOTE — ED Notes (Signed)
md made aware of pt complaint of anxiety

## 2015-01-27 NOTE — Progress Notes (Signed)
Patient Discharge Instructions:  After Visit Summary (AVS):   Faxed to:  01/27/15 Discharge Summary Note:   Faxed to:  01/27/15 Psychiatric Admission Assessment Note:   Faxed to:  01/27/15 Suicide Risk Assessment - Discharge Assessment:   Faxed to:  01/27/15 Faxed/Sent to the Next Level Care provider:  01/27/15 [facility] @ [phone]  Jerelene ReddenSheena E Sulphur, 01/27/2015, 3:28 PM

## 2015-02-02 IMAGING — CR RIGHT ANKLE - 2 VIEW
1 series · 2 of 2 positions shown · non-contrast
Comparison: none

REASON FOR EXAM: trauma; (+) obvious deformity
COMMENTS:

[Series 1: ap · 0.17mm/px · 2 of 2 slices shown]
[im 1/2]
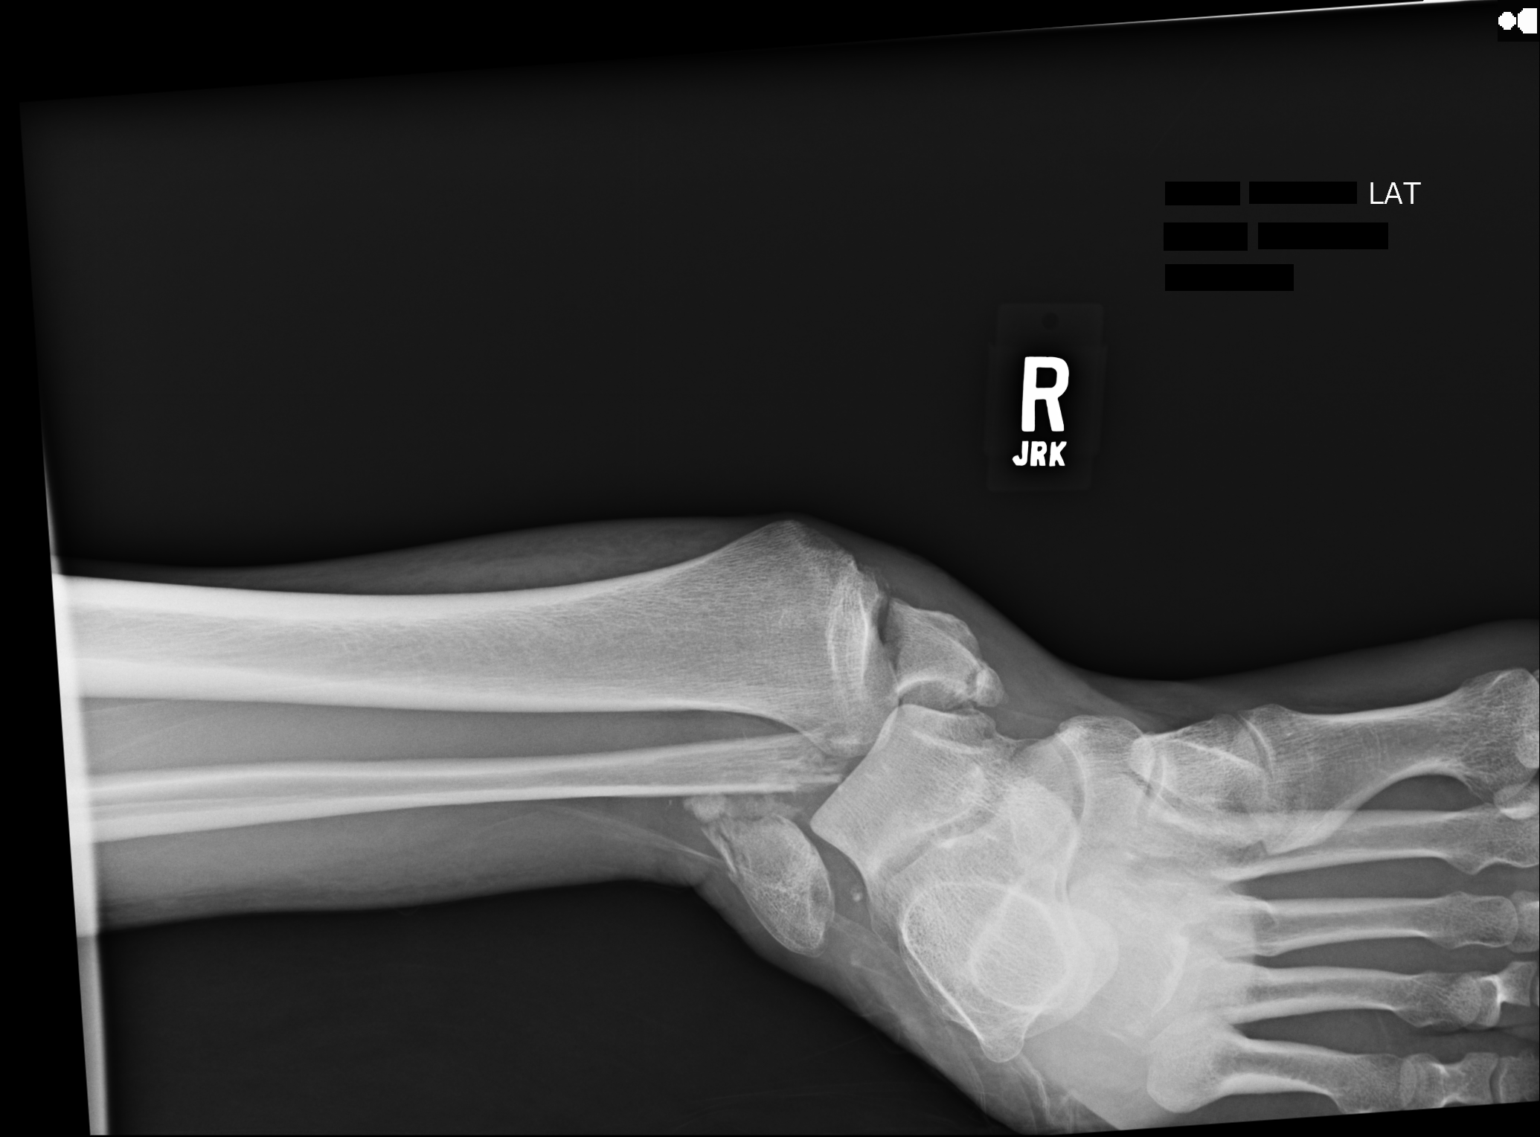
[im 2/2]
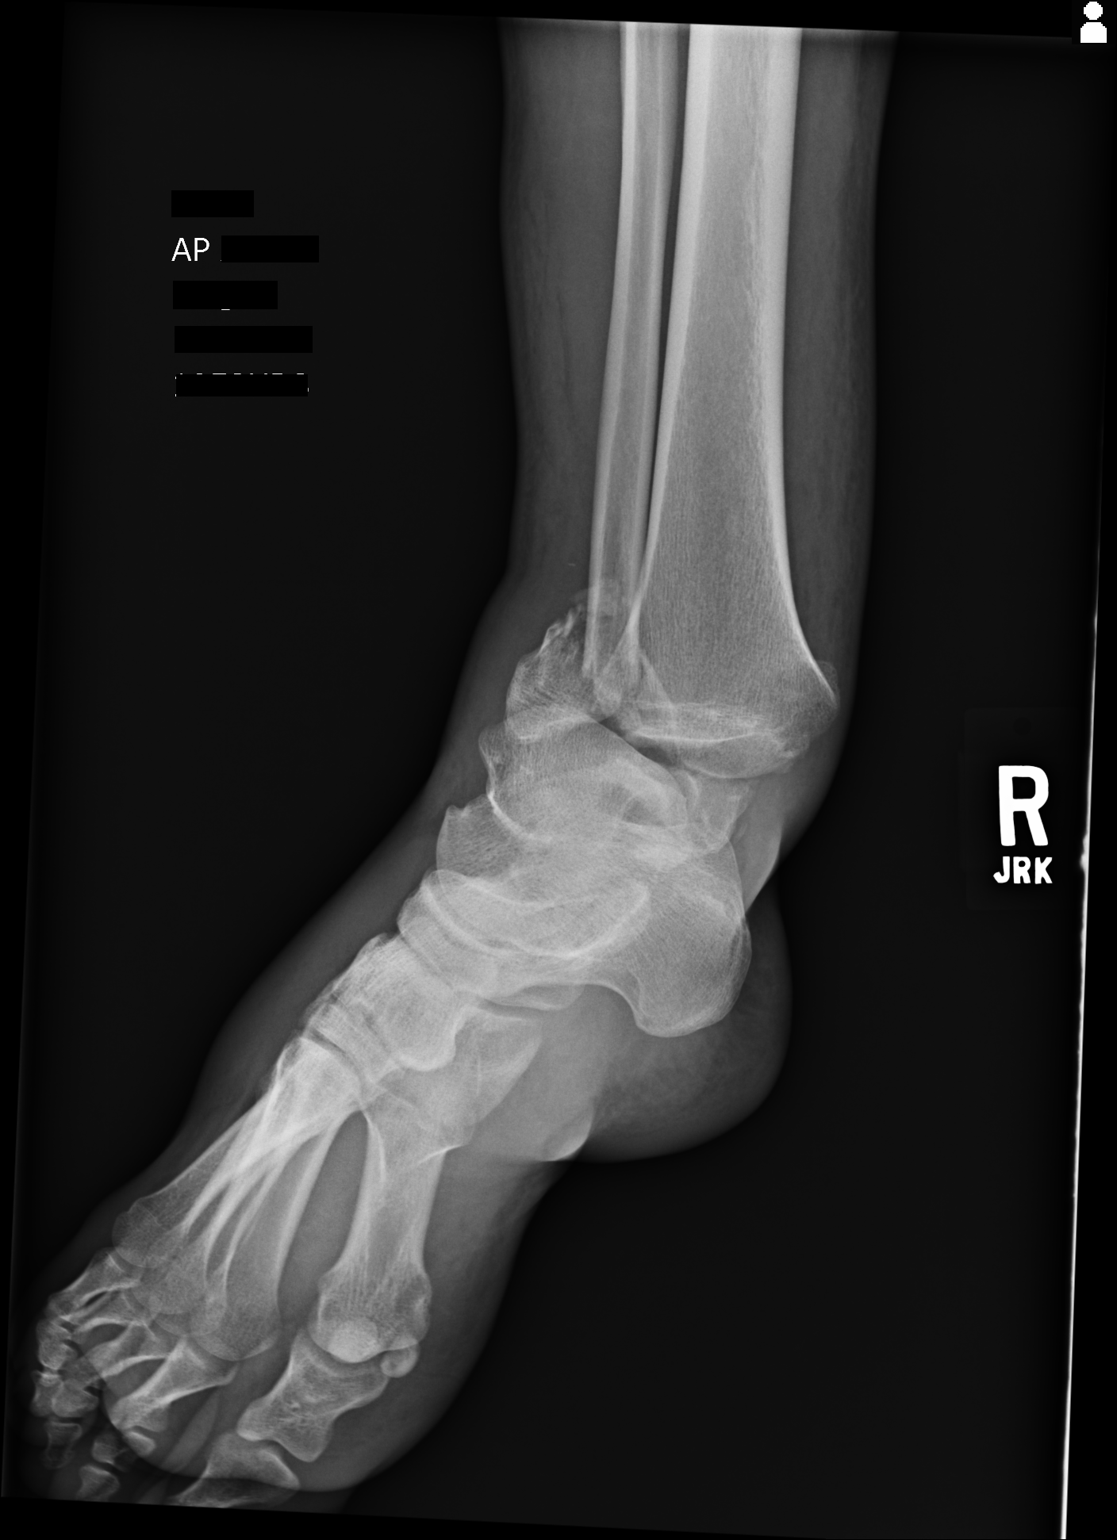

[2 of 2 positions shown; findings below may reference images not displayed]

PROCEDURE:     DXR - DXR ANKLE RIGHT AP AND LATERAL  - September 08, 2013  [DATE]

RESULT:     Right ankle images demonstrate complex comminuted
fracture-dislocation of the right ankle which is at least bimalleolar and
likely trimalleolar but a true lateral view could not be obtained.
Orthopedic surgical consultation and followup imaging is recommended.
IMPRESSION: Fracture-dislocation of the right ankle laterally. A
lateral view was not obtained.

[REDACTED]

## 2015-02-02 IMAGING — CT CT HEAD WITHOUT CONTRAST
1 series · 16 of 30 positions shown, 20 images · non-contrast
Comparison: none

REASON FOR EXAM: trauma
COMMENTS:

PROCEDURE:     CT  - CT HEAD WITHOUT CONTRAST  - September 08, 2013 [DATE]
RESULT:     Comparison:  None
TECHNIQUE: Multiple axial images from the foramen magnum to the vertex were
obtained without IV contrast.

[Series 2: soft tissue · axial · 0.42mm/px · z∈[-163,-28]mm · 16 of 31 slices shown, 20 images]
[im 2/31  brain]
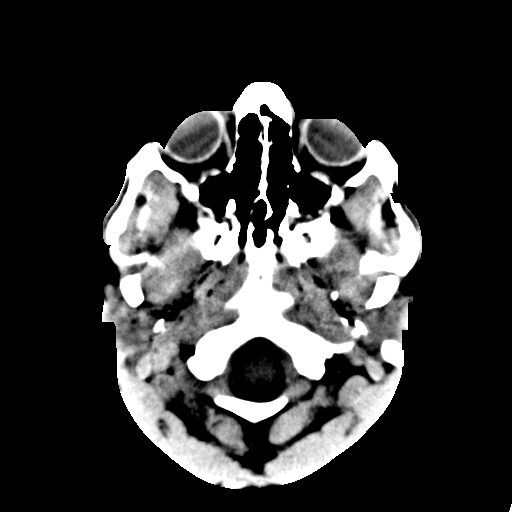
[im 2/31  bone]
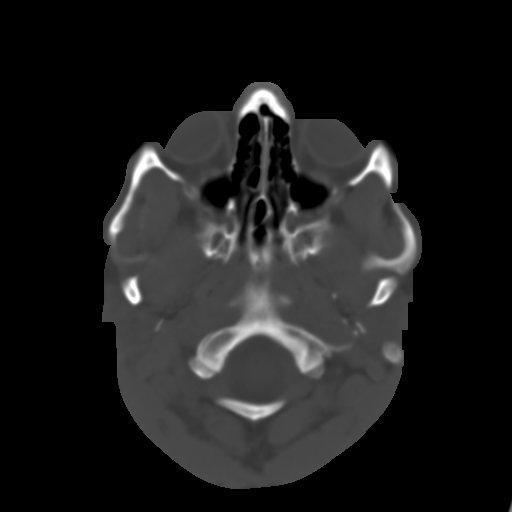
[im 4/31  brain]
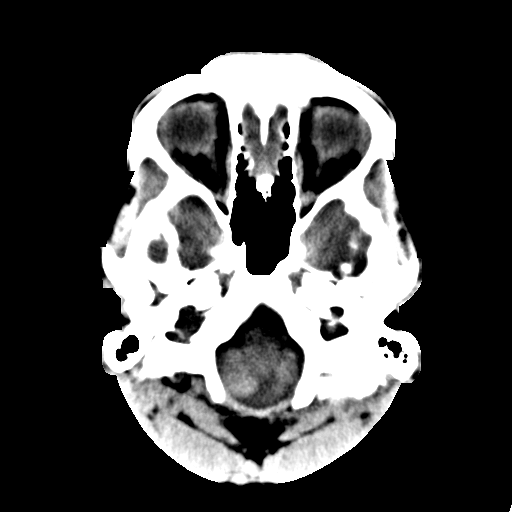
[im 6/31  brain]
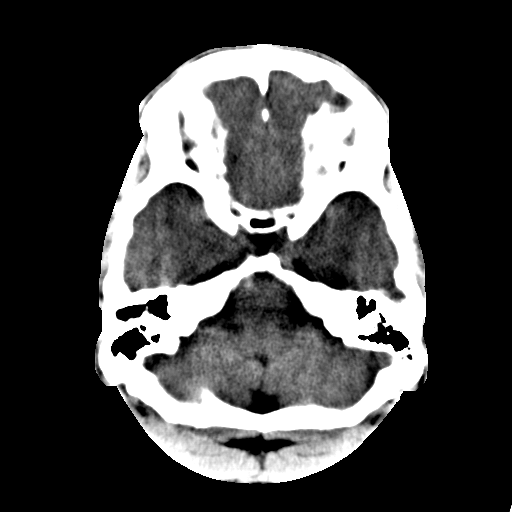
[im 8/31  brain]
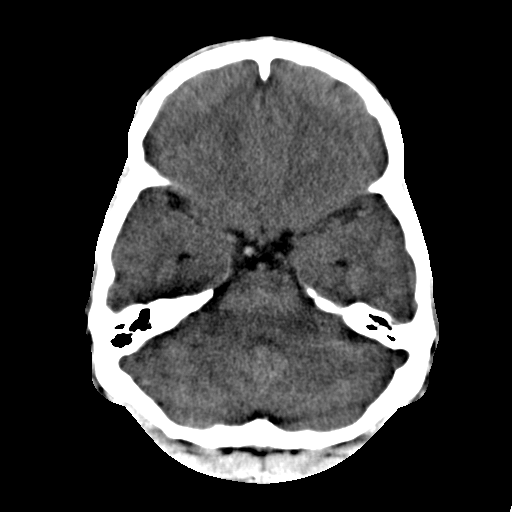
[im 9/31  brain]
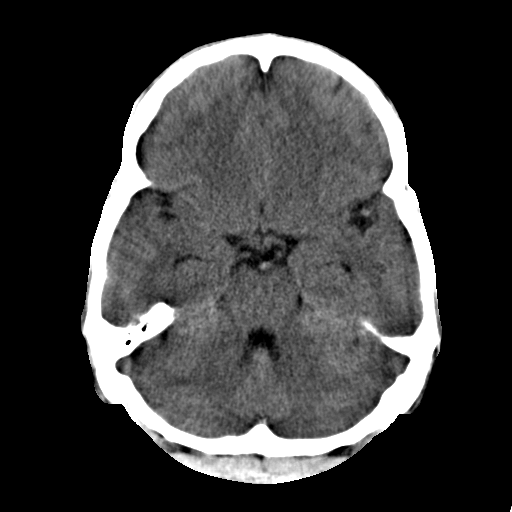
[im 9/31  bone]
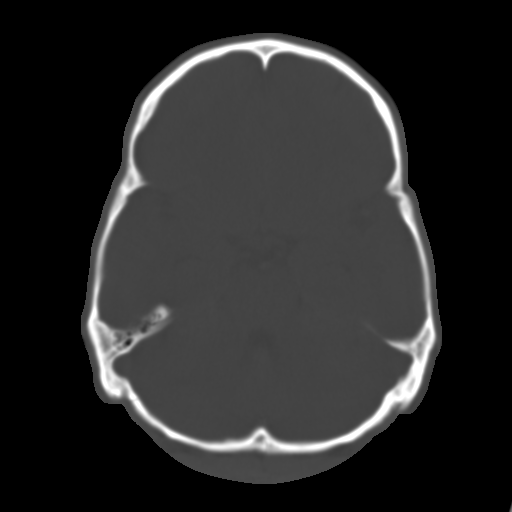
[im 11/31  brain]
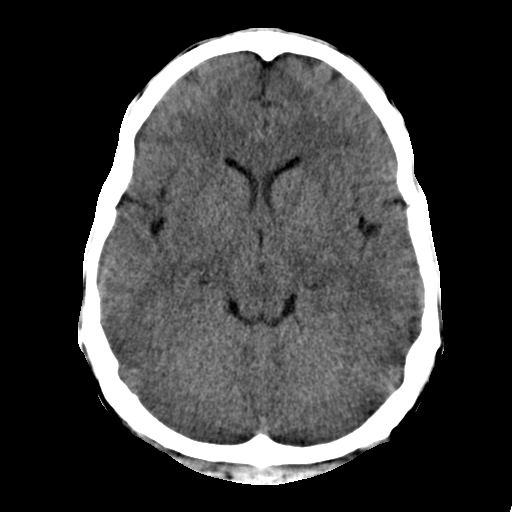
[im 13/31  brain]
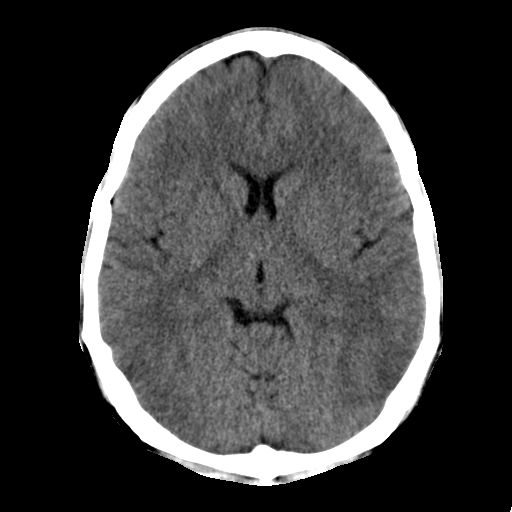
[im 15/31  brain]
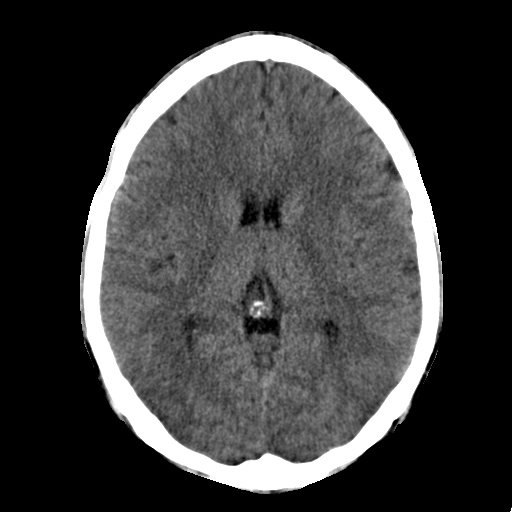
[im 16/31  brain]
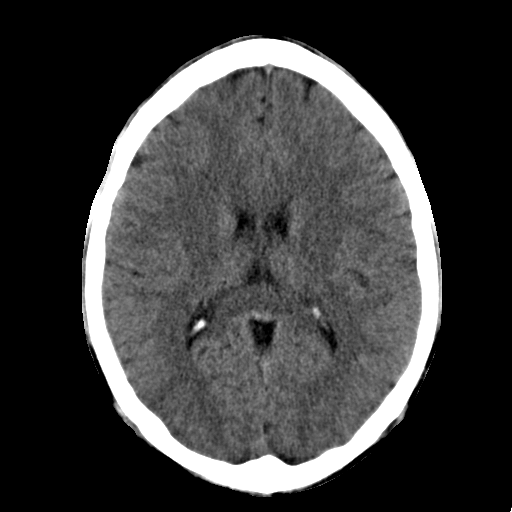
[im 16/31  bone]
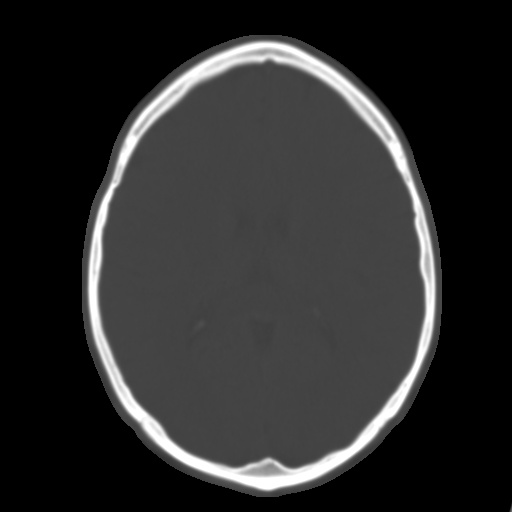
[im 18/31  brain]
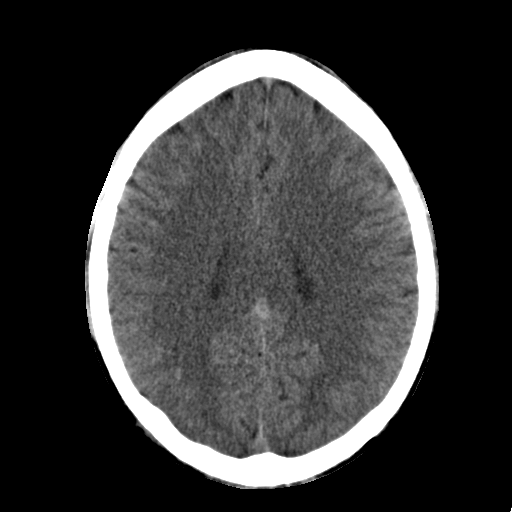
[im 20/31  brain]
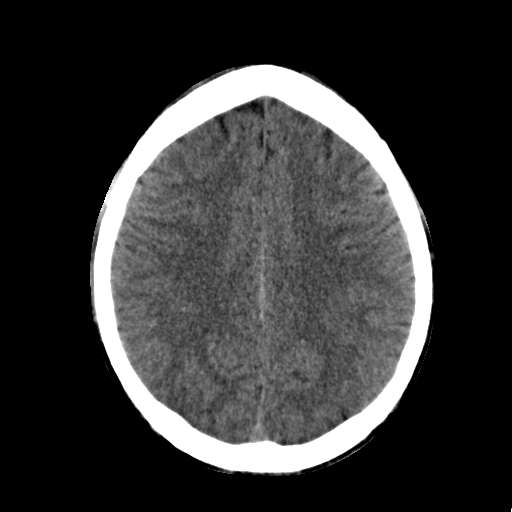
[im 22/31  brain]
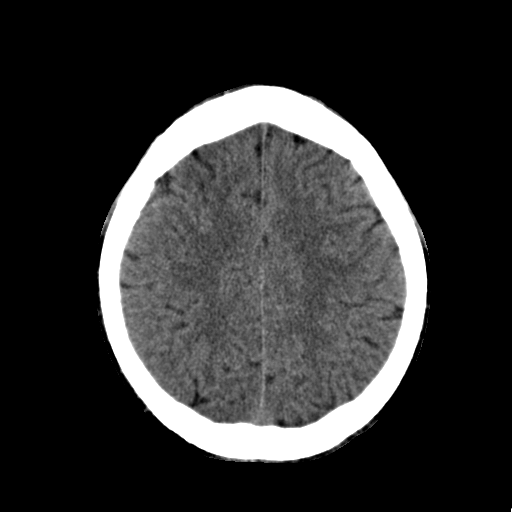
[im 23/31  brain]
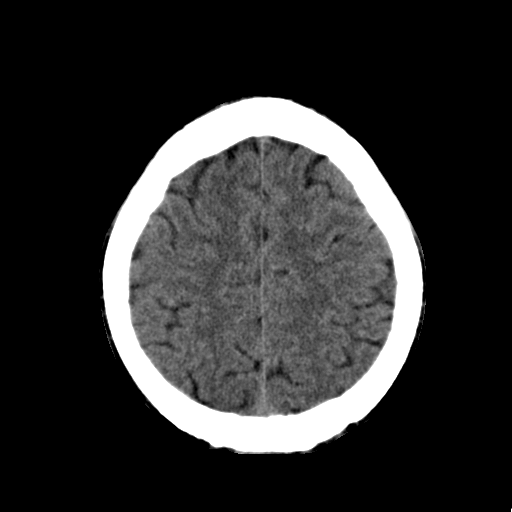
[im 23/31  bone]
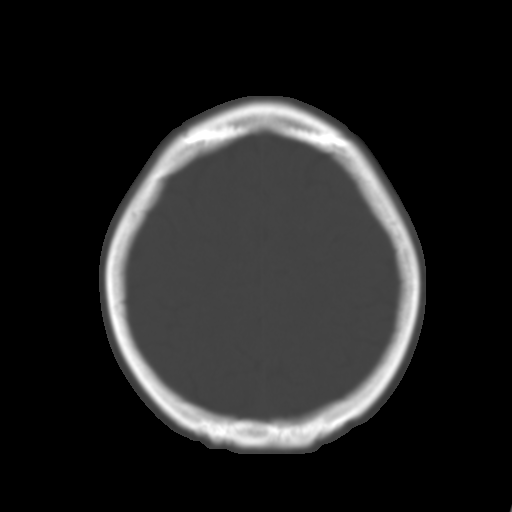
[im 25/31  brain]
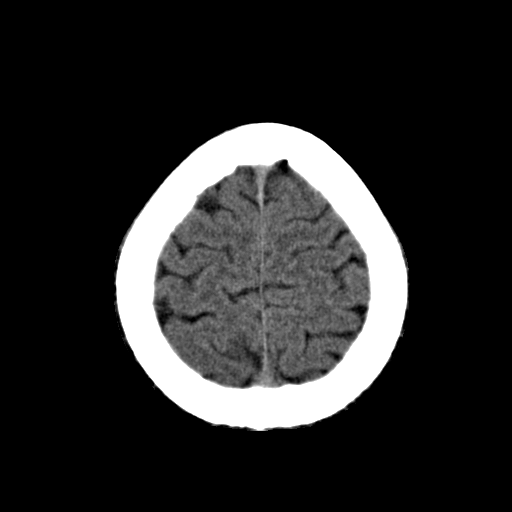
[im 27/31  brain]
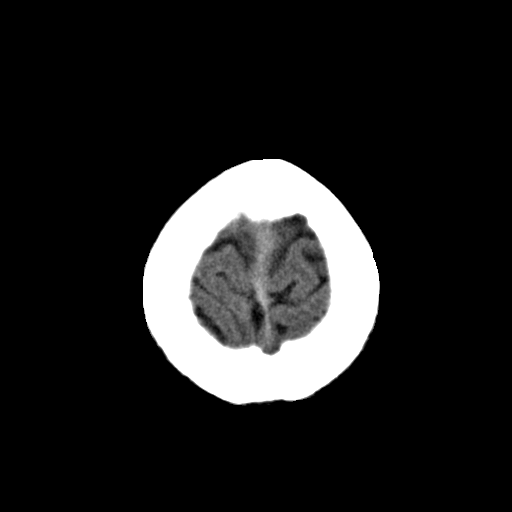
[im 29/31  brain]
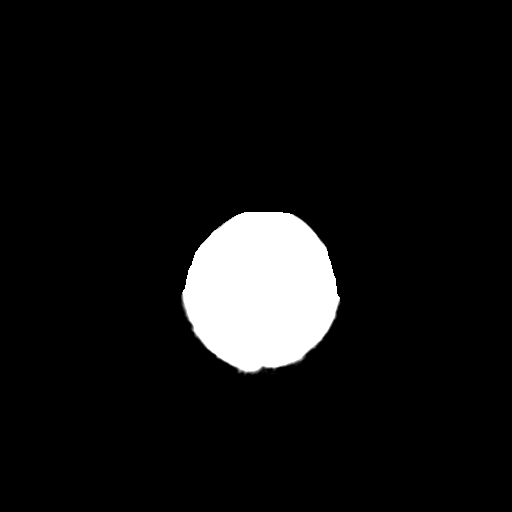

[16 of 30 positions shown; findings below may reference images not displayed]

FINDINGS: There is no evidence of mass effect, midline shift, or extra-axial fluid
collections.  There is no evidence of a space-occupying lesion or
intracranial hemorrhage. There is no evidence of a cortical-based area of
acute infarction.

The ventricles and sulci are appropriate for the patient's age. The basal
cisterns are patent.

Visualized portions of the orbits are unremarkable. The visualized portions
of the paranasal sinuses and mastoid air cells are unremarkable.

The osseous structures are unremarkable.
IMPRESSION: No acute intracranial process.

[REDACTED]

## 2015-02-02 IMAGING — CR RIGHT ANKLE - 2 VIEW
1 series · 3 of 3 positions shown · non-contrast
Comparison: none

REASON FOR EXAM: post reduction
COMMENTS:

[Series 1: lat · 0.17mm/px · 3 of 3 slices shown]
[im 1/3]
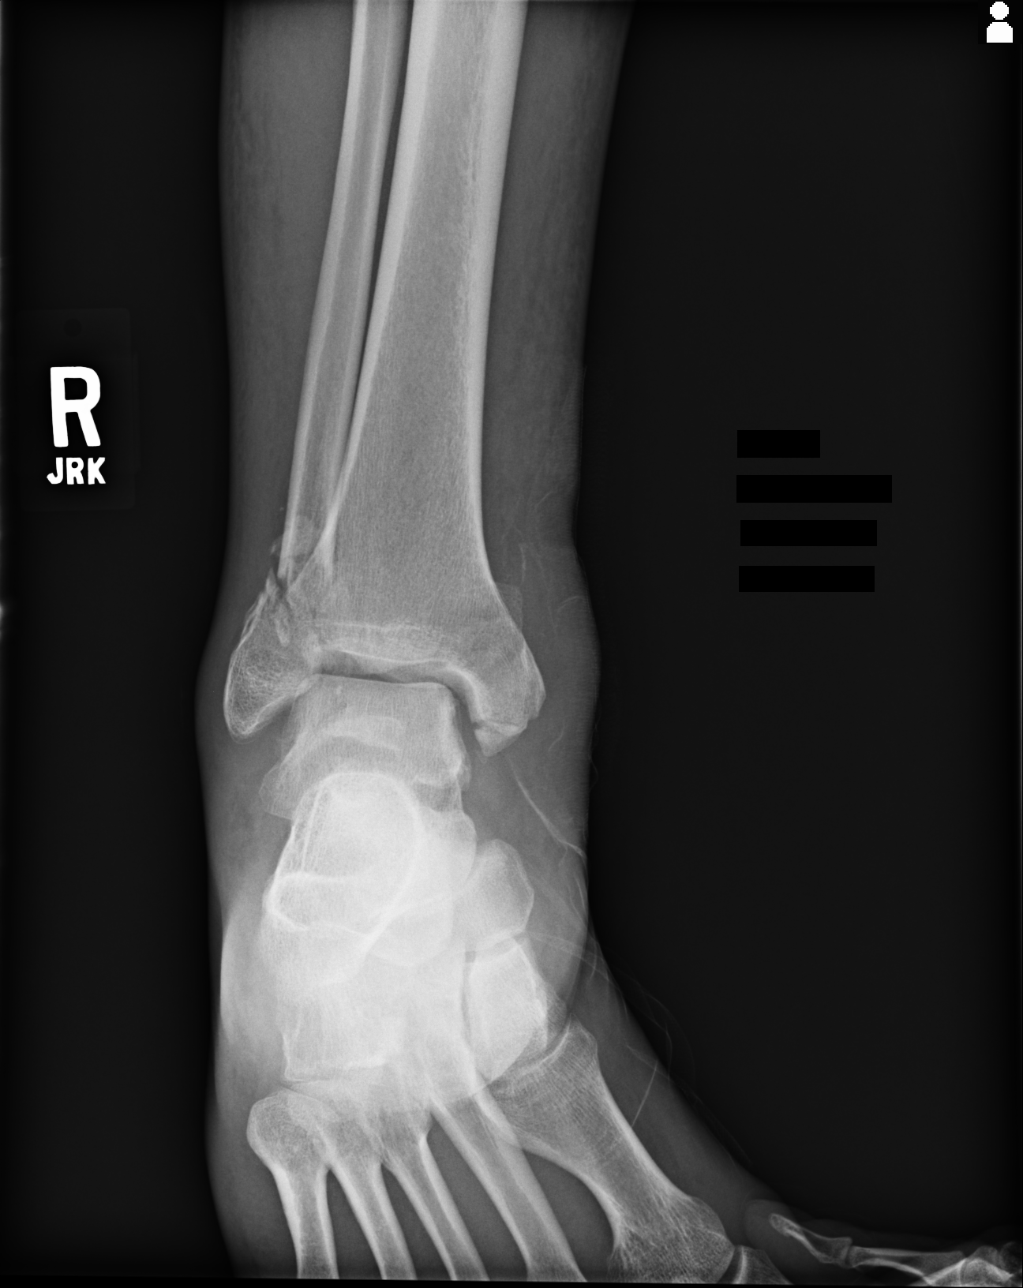
[im 2/3]
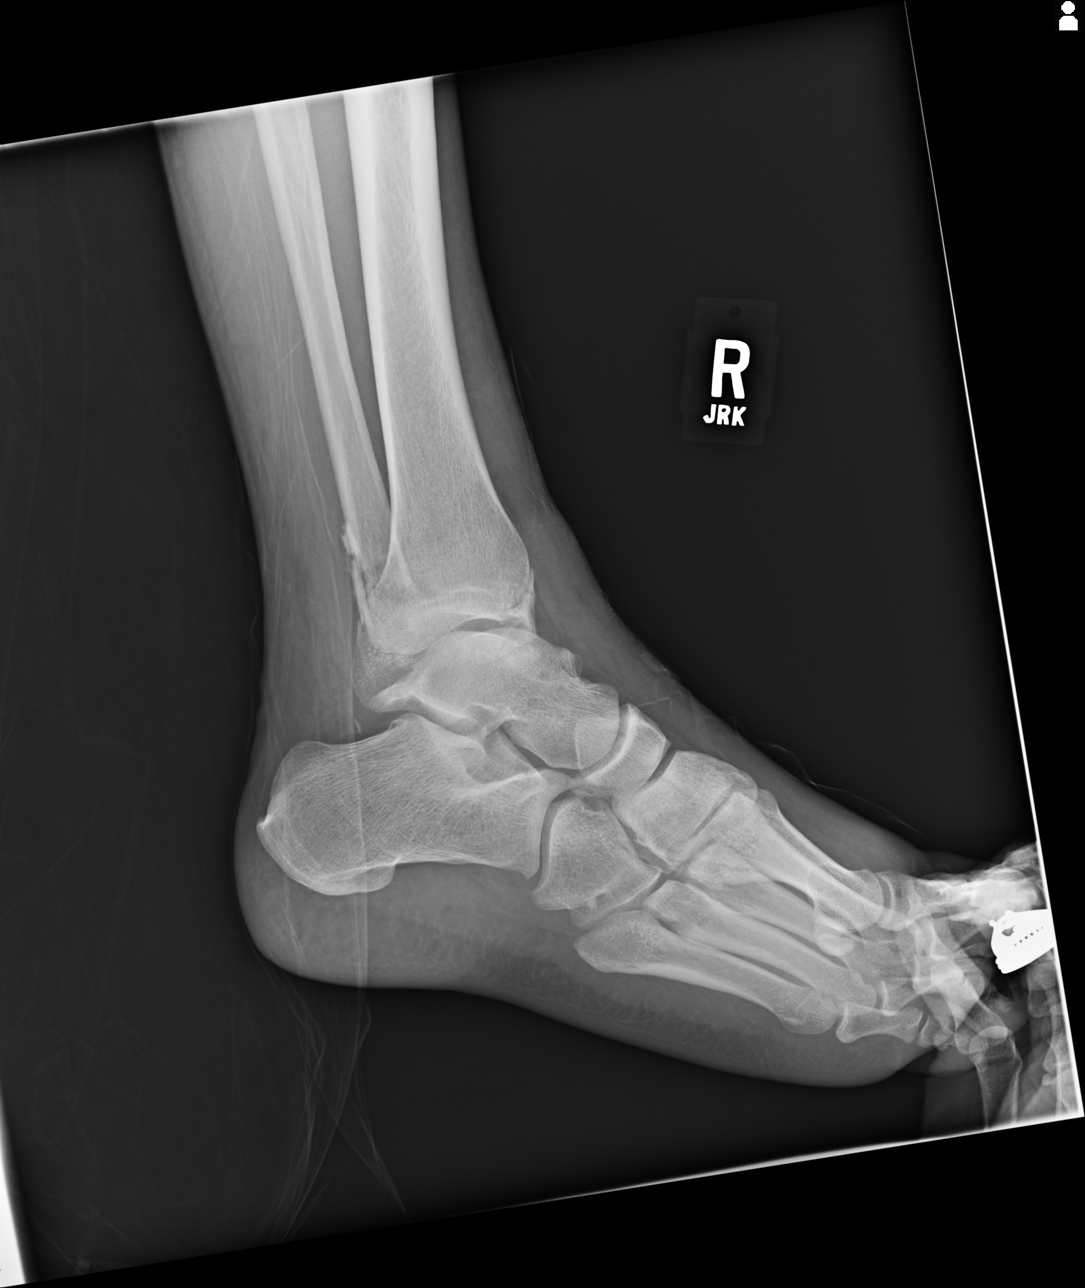
[im 3/3]
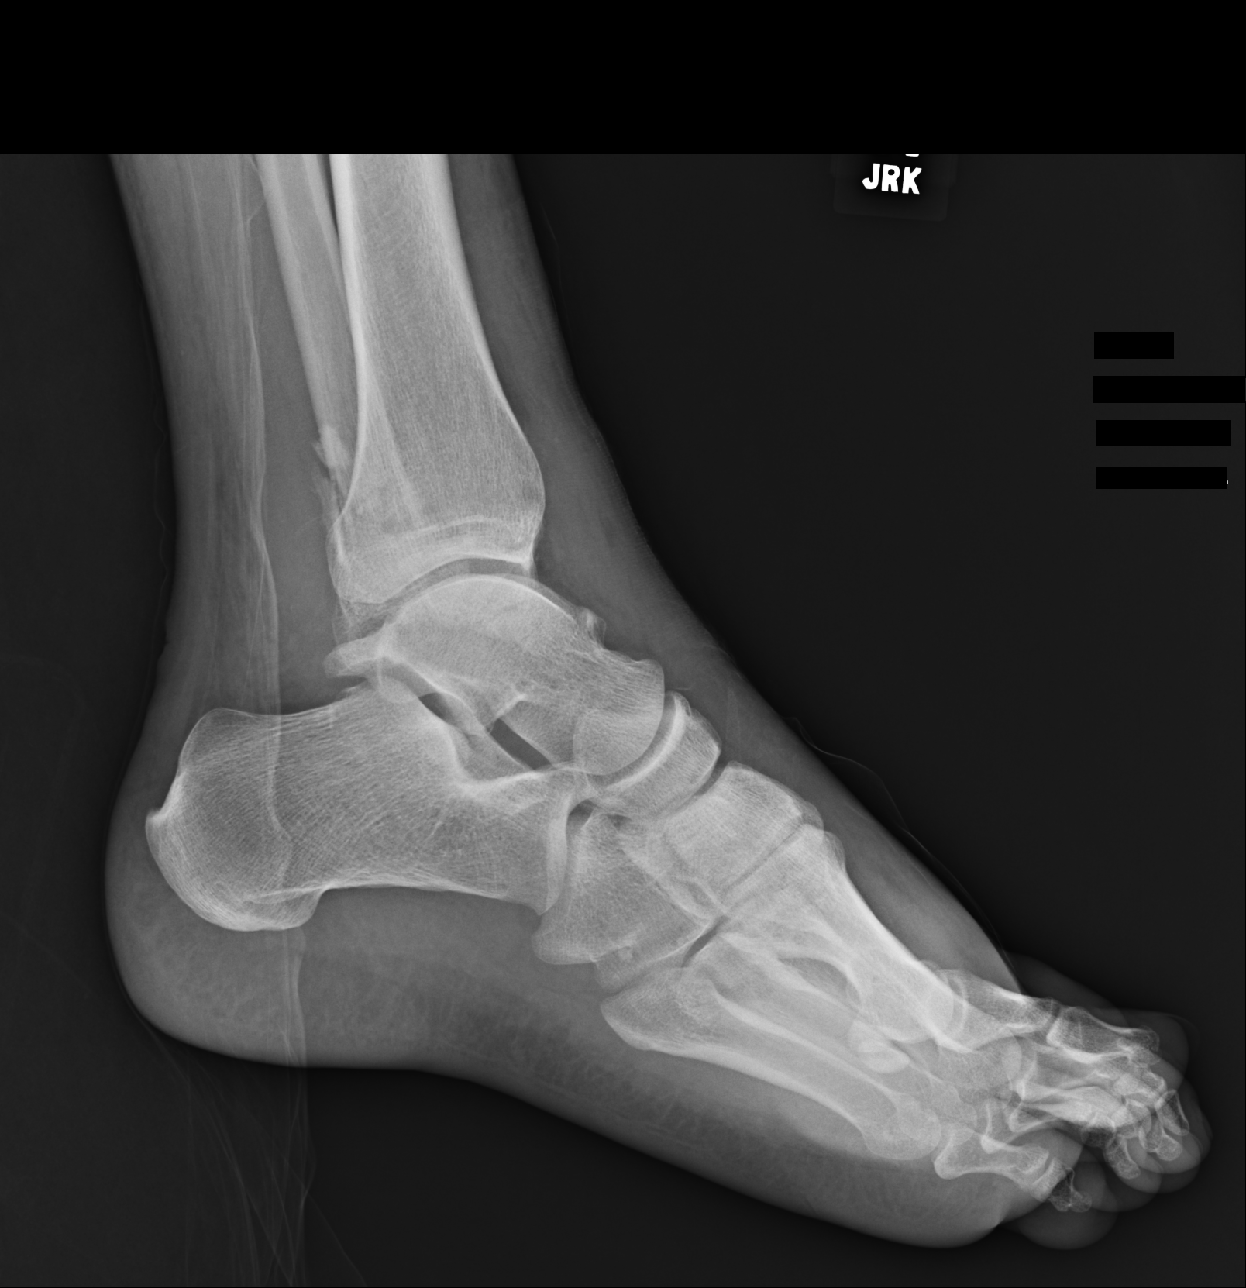

[3 of 3 positions shown; findings below may reference images not displayed]

PROCEDURE:     DXR - DXR ANKLE RIGHT AP AND LATERAL  - September 08, 2013 [DATE]

RESULT:     AP and lateral views demonstrate reduction of the
fracture-dislocation with multiple comminuted fragments about the distal
fibular fracture. The medial malleolar fracture is nicely reduced. The
posterior malleolus appears to be intact.
IMPRESSION: 1. Please see above.

[REDACTED]
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
## 2015-03-12 NOTE — Consult Note (Signed)
Brief Consult Note: Diagnosis: Polysubstance abuse.   Patient was seen by consultant.   Consult note dictated.   Recommend further assessment or treatment.   Orders entered.   Comments: [name] has a long h/o of substance abuse and mood instability. He came to the hospital depressed worried about his fractured foot. This was fixed last night. He requires surgery. There is no insurance. He is not suicidal or homicidal.   PLAN: 1. The patient no longer meets criteria for IVC. I will termionate proceedings. Please discharge as appropriate.  2. No medications or follow up recommented for mental health.   3. He declines substance abuse treatmemnt.   4. He will follo with ORTHO.  Electronic Signatures: Kristine LineaPucilowska, Edd Reppert (MD)  (Signed 31-Oct-14 12:31)  Authored: Brief Consult Note   Last Updated: 31-Oct-14 12:31 by Kristine LineaPucilowska, Kharma Sampsel (MD)
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
## 2015-03-12 NOTE — Consult Note (Signed)
Brief Consult Note: Diagnosis: Polysubstance abuse.   Patient was seen by consultant.   Consult note dictated.   Recommend further assessment or treatment.   Orders entered.   Comments: [name] has a long h/o of substance abuse and mood instability. He came to the hospital depressed and vaguely suicidal with a plan to overdose while under trthe influence. He is sober, cool and collected. He is not suicidal or homicidal.   PLAN: 1. The patient no longer meets criteria for IVC. I will termionate proceedings. Please discharge as appropriate.  2. No medications recommented.   3. He declines substance abuse treatmemnt.   4. [facility].   5. Family will pick him up.  Electronic Signatures: Kristine LineaPucilowska, Jolanta (MD)  (Signed [phone]-Nov-14 14:34)  Authored: Brief Consult Note   Last Updated: 10-Nov-14 14:34 by Kristine LineaPucilowska, Jolanta (MD)
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
## 2015-04-18 ENCOUNTER — Emergency Department (HOSPITAL_COMMUNITY)
Admission: EM | Admit: 2015-04-18 | Discharge: 2015-04-18 | Disposition: A | Discharge status: Other Acute Inpatient Hospital | Payer: Self-pay | Attending: Emergency Medicine | Admitting: Emergency Medicine

## 2015-04-18 ENCOUNTER — Inpatient Hospital Stay (HOSPITAL_COMMUNITY)
Admission: AD | Admit: 2015-04-18 | Discharge: 2015-04-19 | DRG: 885 | Disposition: A | Discharge status: Home / Self Care | Payer: Federal, State, Local not specified - Other | Source: Intra-hospital | Attending: Psychiatry | Admitting: Psychiatry

## 2015-04-18 ENCOUNTER — Encounter (HOSPITAL_COMMUNITY): Payer: Self-pay | Admitting: *Deleted

## 2015-04-18 ENCOUNTER — Encounter (HOSPITAL_COMMUNITY): Payer: Self-pay | Admitting: Emergency Medicine

## 2015-04-18 DIAGNOSIS — G479 Sleep disorder, unspecified: Secondary | ICD-10-CM | POA: Insufficient documentation

## 2015-04-18 DIAGNOSIS — F331 Major depressive disorder, recurrent, moderate: Secondary | ICD-10-CM

## 2015-04-18 DIAGNOSIS — F29 Unspecified psychosis not due to a substance or known physiological condition: Secondary | ICD-10-CM | POA: Insufficient documentation

## 2015-04-18 DIAGNOSIS — R45851 Suicidal ideations: Secondary | ICD-10-CM

## 2015-04-18 DIAGNOSIS — Z720 Tobacco use: Secondary | ICD-10-CM | POA: Insufficient documentation

## 2015-04-18 DIAGNOSIS — F17210 Nicotine dependence, cigarettes, uncomplicated: Secondary | ICD-10-CM | POA: Diagnosis present

## 2015-04-18 DIAGNOSIS — F1920 Other psychoactive substance dependence, uncomplicated: Secondary | ICD-10-CM | POA: Diagnosis present

## 2015-04-18 DIAGNOSIS — F4310 Post-traumatic stress disorder, unspecified: Secondary | ICD-10-CM | POA: Diagnosis present

## 2015-04-18 DIAGNOSIS — F1994 Other psychoactive substance use, unspecified with psychoactive substance-induced mood disorder: Secondary | ICD-10-CM | POA: Diagnosis present

## 2015-04-18 DIAGNOSIS — F411 Generalized anxiety disorder: Secondary | ICD-10-CM | POA: Diagnosis present

## 2015-04-18 DIAGNOSIS — Z79899 Other long term (current) drug therapy: Secondary | ICD-10-CM | POA: Insufficient documentation

## 2015-04-18 DIAGNOSIS — G4700 Insomnia, unspecified: Secondary | ICD-10-CM | POA: Diagnosis present

## 2015-04-18 DIAGNOSIS — F332 Major depressive disorder, recurrent severe without psychotic features: Secondary | ICD-10-CM | POA: Diagnosis present

## 2015-04-18 DIAGNOSIS — F419 Anxiety disorder, unspecified: Secondary | ICD-10-CM | POA: Insufficient documentation

## 2015-04-18 LAB — I-STAT CHEM 8, ED
BUN: 8 mg/dL (ref 6–20)
CALCIUM ION: 1.15 mmol/L (ref 1.12–1.23)
CHLORIDE: 105 mmol/L (ref 101–111)
CREATININE: 1 mg/dL (ref 0.61–1.24)
Glucose, Bld: 97 mg/dL (ref 65–99)
HCT: 39 % (ref 39.0–52.0)
Hemoglobin: 13.3 g/dL (ref 13.0–17.0)
Potassium: 3.2 mmol/L — ABNORMAL LOW (ref 3.5–5.1)
Sodium: 140 mmol/L (ref 135–145)
TCO2: 20 mmol/L (ref 0–100)

## 2015-04-18 LAB — RAPID URINE DRUG SCREEN, HOSP PERFORMED
Amphetamines: NOT DETECTED
BARBITURATES: NOT DETECTED
BENZODIAZEPINES: NOT DETECTED
Cocaine: NOT DETECTED
Opiates: NOT DETECTED
Tetrahydrocannabinol: POSITIVE — AB

## 2015-04-18 LAB — COMPREHENSIVE METABOLIC PANEL
ALT: 13 U/L — AB (ref 17–63)
AST: 17 U/L (ref 15–41)
Albumin: 4.3 g/dL (ref 3.5–5.0)
Alkaline Phosphatase: 56 U/L (ref 38–126)
Anion gap: 8 (ref 5–15)
BUN: 9 mg/dL (ref 6–20)
CALCIUM: 9 mg/dL (ref 8.9–10.3)
CHLORIDE: 107 mmol/L (ref 101–111)
CO2: 22 mmol/L (ref 22–32)
Creatinine, Ser: 0.91 mg/dL (ref 0.61–1.24)
GFR calc Af Amer: >60 mL/min (ref >60)
Glucose, Bld: 99 mg/dL (ref 65–99)
Potassium: 3.3 mmol/L — ABNORMAL LOW (ref 3.5–5.1)
SODIUM: 137 mmol/L (ref 135–145)
Total Bilirubin: 0.7 mg/dL (ref 0.3–1.2)
Total Protein: 6.9 g/dL (ref 6.5–8.1)

## 2015-04-18 LAB — CBC WITH DIFFERENTIAL/PLATELET
Basophils Absolute: 0 10*3/uL (ref 0.0–0.1)
Basophils Relative: 0 % (ref 0–1)
EOS ABS: 0.1 10*3/uL (ref 0.0–0.7)
Eosinophils Relative: 1 % (ref 0–5)
HCT: 38.4 % — ABNORMAL LOW (ref 39.0–52.0)
Hemoglobin: 13.4 g/dL (ref 13.0–17.0)
LYMPHS ABS: 2.7 10*3/uL (ref 0.7–4.0)
Lymphocytes Relative: 30 % (ref 12–46)
MCH: 30.6 pg (ref 26.0–34.0)
MCHC: 34.9 g/dL (ref 30.0–36.0)
MCV: 87.7 fL (ref 78.0–100.0)
Monocytes Absolute: 1.2 10*3/uL — ABNORMAL HIGH (ref 0.1–1.0)
Monocytes Relative: 13 % — ABNORMAL HIGH (ref 3–12)
Neutro Abs: 5 10*3/uL (ref 1.7–7.7)
Neutrophils Relative %: 56 % (ref 43–77)
Platelets: 298 10*3/uL (ref 150–400)
RBC: 4.38 MIL/uL (ref 4.22–5.81)
RDW: 12.2 % (ref 11.5–15.5)
WBC: 8.9 10*3/uL (ref 4.0–10.5)

## 2015-04-18 LAB — ETHANOL: Alcohol, Ethyl (B): <5 mg/dL (ref <5)

## 2015-04-18 MED ORDER — POTASSIUM CHLORIDE CRYS ER 20 MEQ PO TBCR
20.0000 meq | EXTENDED_RELEASE_TABLET | ORAL | Status: AC
  Administered 2015-04-18: 20 meq via ORAL
  Filled 2015-04-18: qty 1

## 2015-04-18 MED ORDER — ACETAMINOPHEN 325 MG PO TABS: 650.0000 mg | ORAL_TABLET | Freq: Four times a day (QID) | ORAL | Status: DC | PRN

## 2015-04-18 MED ORDER — MAGNESIUM HYDROXIDE 400 MG/5ML PO SUSP: 30.0000 mL | Freq: Every day | ORAL | Status: DC | PRN

## 2015-04-18 MED ORDER — NICOTINE 21 MG/24HR TD PT24: 21.0000 mg | MEDICATED_PATCH | Freq: Every day | TRANSDERMAL | Status: DC

## 2015-04-18 MED ORDER — PNEUMOCOCCAL VAC POLYVALENT 25 MCG/0.5ML IJ INJ: 0.5000 mL | INJECTION | INTRAMUSCULAR | Status: DC

## 2015-04-18 MED ORDER — CARBAMAZEPINE 200 MG PO TABS
200.0000 mg | ORAL_TABLET | Freq: Three times a day (TID) | ORAL | Status: DC
  Filled 2015-04-18 (×7): qty 1

## 2015-04-18 MED ORDER — ACETAMINOPHEN 325 MG PO TABS: 650.0000 mg | ORAL_TABLET | ORAL | Status: DC | PRN

## 2015-04-18 MED ORDER — IBUPROFEN 200 MG PO TABS: 600.0000 mg | ORAL_TABLET | Freq: Three times a day (TID) | ORAL | Status: DC | PRN

## 2015-04-18 MED ORDER — ALUM & MAG HYDROXIDE-SIMETH 200-200-20 MG/5ML PO SUSP: 30.0000 mL | ORAL | Status: DC | PRN

## 2015-04-18 MED ORDER — ONDANSETRON HCL 4 MG PO TABS: 4.0000 mg | ORAL_TABLET | Freq: Three times a day (TID) | ORAL | Status: DC | PRN

## 2015-04-18 MED ORDER — ZOLPIDEM TARTRATE 5 MG PO TABS: 5.0000 mg | ORAL_TABLET | Freq: Every evening | ORAL | Status: DC | PRN

## 2015-04-18 MED ORDER — LORAZEPAM 1 MG PO TABS: 1.0000 mg | ORAL_TABLET | Freq: Three times a day (TID) | ORAL | Status: DC | PRN

## 2015-04-18 NOTE — [facility] Counselor (Signed)
Pt reviewed and signed voluntary consent to treat and release of information. Paperwork was provided to transportation.

## 2015-04-18 NOTE — [facility] Suicide Risk Assessment (Signed)
[facility] INPATIENT:  Family/Significant Other Suicide Prevention Education  Suicide Prevention Education:  Patient Refusal for Family/Significant Other Suicide Prevention Education: The patient Alec PilgrimBrandon G Latona has refused to provide written consent for family/significant other to be provided Family/Significant Other Suicide Prevention Education during admission and/or prior to discharge.  Physician notified. Writer provided suicide prevention education directly to patient; conversation included risk factors, warning signs and resources to contact for help. Mobile crisis services explained and  explanations given as to resources which will be included on patient's discharge paperwork.   Clide DalesHarrill, Javayah Magaw Campbell 04/18/2015, 4:39 PM
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
## 2015-04-18 NOTE — ED Provider Notes (Signed)
CSN: [id number]     Arrival date & time 04/18/15  0138 History   First MD Initiated Contact with Patient 04/18/15 0155     Chief Complaint  Patient presents with  . Hallucinations  . Suicidal     (Consider location/radiation/quality/duration/timing/severity/associated sxs/prior Treatment) HPI Comments: This is a 24 year old male with significant psychiatric history of "mental disorder depression and anxiety is also had substance abuse issues in the past states he was released from prison 2 weeks ago and since that time he has been staying with friends he reports that he has not been able to sleep for the past 5 days that he will fall asleep for 10 or 15 minutes and then be up for 10 or 12 hours he reports that his appetite has been decreased he denies regular drug use at this time denies any alcohol but he does state that he "dropped some acid" this evening. The voices in his head or telling him that he has a vampire. He states that he has not had any of his regular medication since he went to prison to a half months ago these medications include Klonopin and Tegretol.   The history is provided by the patient.    Past Medical History  Diagnosis Date  . Mental disorder   . Depression   . Anxiety    History reviewed. No pertinent past surgical history. History reviewed. No pertinent family history. History  Substance Use Topics  . Smoking status: Current Every Day Smoker -- 0.50 packs/day for 11 years    Types: Cigarettes  . Smokeless tobacco: Not on file  . Alcohol Use: 14.4 oz/week    24 Cans of beer per week     Comment: case daily    Review of Systems  Respiratory: Negative for shortness of breath.   Cardiovascular: Negative for chest pain.  Genitourinary: Negative for dysuria.  Neurological: Negative for dizziness and headaches.  Psychiatric/Behavioral: Positive for hallucinations and sleep disturbance.  All other systems reviewed and are negative.     Allergies   Review of patient's allergies indicates no known allergies.  Home Medications   Prior to Admission medications   Medication Sig Start Date End Date Taking? Authorizing Provider  carbamazepine (TEGRETOL) 200 MG tablet Take 1 tablet (200 mg total) by mouth 3 (three) times daily. 01/24/15   Adonis BrookSheila Agustin, NP  clonazePAM (KLONOPIN) 0.5 MG tablet Take 1 tablet (0.5 mg total) by mouth 3 (three) times daily as needed for anxiety. 01/26/15   Ankit Rhunette CroftNanavati, MD   BP 126/83 mmHg  Pulse 84  Temp(Src) 97.6 F (36.4 C) (Oral)  Resp 18  SpO2 97% Physical Exam  Constitutional: He is oriented to person, place, and time. He appears well-developed and well-nourished.  HENT:  Head: Normocephalic.  Eyes: Pupils are equal, round, and reactive to light.  Neck: Normal range of motion.  Cardiovascular: Normal rate.   Pulmonary/Chest: Effort normal.  Neurological: He is alert and oriented to person, place, and time.  Skin: Skin is warm and dry. No rash noted.  Psychiatric: His speech is normal and behavior is normal. Judgment normal. His mood appears anxious. Thought content is delusional. Cognition and memory are impaired. He exhibits a depressed mood.  Nursing note and vitals reviewed.   ED Course  Procedures (including critical care time) Labs Review Labs Reviewed  CBC WITH DIFFERENTIAL/PLATELET - Abnormal; Notable for the following:    HCT 38.4 (*)    Monocytes Relative 13 (*)    Monocytes Absolute  1.2 (*)    All other components within normal limits  COMPREHENSIVE METABOLIC PANEL - Abnormal; Notable for the following:    Potassium 3.3 (*)    ALT 13 (*)    All other components within normal limits  I-STAT CHEM 8, ED - Abnormal; Notable for the following:    Potassium 3.2 (*)    All other components within normal limits  URINE RAPID DRUG SCREEN (HOSP PERFORMED) NOT AT [facility]  ETHANOL    Imaging Review No results found.   EKG Interpretation None     Patient has been assessed by TTS he's  been accepted [facility] inpatient [facility] by Dr. Elna Breslow.  Patient has been informed of this and is agreeable MDM   Final diagnoses:  Psychosis, unspecified psychosis type         Earley Favor, NP 04/18/15 0304  Paula Libra, MD 04/18/15 [phone]
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
## 2015-04-18 NOTE — ED Notes (Signed)
TTS completed. 

## 2015-04-18 NOTE — [facility] Suicide Risk Assessment (Signed)
[facility] Admission Suicide Risk Assessment   Nursing information obtained from:    Demographic factors:    Current Mental Status:    Loss Factors:    Historical Factors:    Risk Reduction Factors:    Total Time spent with patient: 45 minutes Principal Problem: MDD (major depressive disorder), recurrent episode, severe Diagnosis:   Patient Active Problem List   Diagnosis Date Noted  . MDD (major depressive disorder), recurrent episode, severe [F33.2] 04/18/2015  . Major depressive disorder, recurrent episode, moderate [F33.1]   . Major depressive disorder, recurrent episode, severe [F33.2] 01/23/2015    Class: Acute  . Major depressive disorder, recurrent [F33.9] 01/23/2015  . Severe major depression without psychotic features [F32.2] 08/27/2014  . Ankle contusion [S90.00XA] 08/24/2014  . Adjustment disorder [F43.20] 08/24/2014  . Polysubstance (excluding opioids) dependence without physiological dependence [F19.20] 09/22/2013  . 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 Suicide threat or attempt [F48.9] 09/22/2013  . PTSD (post-traumatic stress disorder) [F43.10] 09/22/2013  . Suicidal ideation [R45.851] 09/22/2013  . Major depressive disorder, recurrent episode, severe, specified as with psychotic behavior [F33.3] 09/17/2013  . Polysubstance dependence [F19.20] 09/17/2013     Continued Clinical Symptoms:  Alcohol Use Disorder Identification Test Final Score (AUDIT): 7 The "Alcohol Use Disorders Identification Test", Guidelines for Use in Primary Care, Second Edition.  World Science writer [facility]). Score between 0-7:  no or low risk or alcohol related problems. Score between 8-15:  moderate risk of alcohol related problems. Score between 16-19:  high risk of alcohol related problems. Score 20 or above:  warrants further diagnostic evaluation for alcohol dependence and treatment.   CLINICAL FACTORS:   Alcohol/Substance  Abuse/Dependencies   Musculoskeletal: Strength & Muscle Tone: within normal limits Gait & Station: normal Patient leans: normal  Psychiatric Specialty Exam: Physical Exam  Review of Systems  Constitutional: Negative.   HENT: Negative.   Eyes: Negative.   Respiratory: Negative.   Cardiovascular: Negative.   Gastrointestinal: Negative.   Genitourinary: Negative.   Musculoskeletal: Negative.   Skin: Negative.   Neurological: Negative.   Endo/Heme/Allergies: Negative.   Psychiatric/Behavioral: Positive for depression and substance abuse. The patient is nervous/anxious.     Blood pressure 116/68, pulse 86, temperature 98.6 F (37 C), temperature source Oral, resp. rate 20, height  (1.676 m), weight 61.689 kg (136 lb).Body mass index is 21.96 kg/(m^2).  General Appearance: Fairly Groomed  Patent attorney::  Fair  Speech:  Clear and Coherent  Volume:  Normal  Mood:  Anxious, irritable  Affect:  anxious  Thought Process:  Coherent and Goal Directed  Orientation:  Full (Time, Place, and Person)  Thought Content:  most recent events worries concerns  Suicidal Thoughts:  No  Homicidal Thoughts:  No  Memory:  Immediate;   Fair Recent;   Fair Remote;   Fair  Judgement:  Fair  Insight:  Present  Psychomotor Activity:  Restlessness  Concentration:  Fair  Recall:  [employer] of Knowledge:Fair  Language: Fair  Akathisia:  No  Handed:  Right  AIMS (if indicated):     Assets:  Resilience Others:  resourceful  Sleep:     Cognition: WNL  ADL's:  Intact     COGNITIVE FEATURES THAT CONTRIBUTE TO RISK:  Closed-mindedness, Polarized thinking and Thought constriction (tunnel vision)    SUICIDE RISK:   Mild:  Suicidal ideation of limited frequency, intensity, duration, and specificity.  There are no identifiable plans, no associated intent, mild dysphoria and related  symptoms, good self-control (both objective and subjective assessment), few other risk factors, and identifiable  protective factors, including available and accessible social support. 24 Y/o male known to our service who was just released from jail. Came stating that he believed he was a vampire. Has a history of Polysubstance Dependence. Currently the UDS is positive for cannabis. He is already requesting to be D/C. Willing to wait until the AM. States there are friends he can stay with. Clarifies that he does not think he is a vampire anymore PLAN OF CARE: Supportive approach/coping skills                               Polysubstance Dependence; will identify any possible detox needs. Will work a relapse prevention plan                               Anxiety Disorder; will reassess for the use of psychotropics other than the                                   Klonopin 10 mg TID that he is requesting                                Will use CBT/mindfulness and encourage to get involved in a therapeutic                                      process Medical Decision Making:  Review of Psycho-Social Stressors (1), Review or order clinical lab tests (1) and Review of Medication Regimen & Side Effects (2)  I certify that inpatient services furnished can reasonably be expected to improve the patient's condition.   Callie Bunyard A 04/18/2015, 6:25 PM

## 2015-04-18 NOTE — Tx Team (Signed)
Initial Interdisciplinary Treatment Plan   PATIENT STRESSORS: Financial difficulties Legal issue Substance abuse   PATIENT STRENGTHS: Ability for insight Capable of independent living Communication skills Supportive family/friends   PROBLEM LIST: Problem List/Patient Goals Date to be addressed Date deferred Reason deferred Estimated date of resolution  Suicide attempt      Depression      " I need help to taking taking those drugs that makes me crazy"                                           DISCHARGE CRITERIA:  Ability to meet basic life and health needs Improved stabilization in mood, thinking, and/or behavior Motivation to continue treatment in a less acute level of care Reduction of life-threatening or endangering symptoms to within safe limits  PRELIMINARY DISCHARGE PLAN: Attend PHP/IOP Outpatient therapy Return to previous living arrangement  PATIENT/FAMIILY INVOLVEMENT: This treatment plan has been presented to and reviewed with the patient, [name], and/or family member.  The patient and family have been given the opportunity to ask questions and make suggestions.  [name], [name] 04/18/2015, 5:43 AM
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
## 2015-04-18 NOTE — Progress Notes (Signed)
Psychoeducational Group Note  Date:  04/18/2015 Time:  2053  Group Topic/Focus:  Wrap-Up Group:   The focus of this group is to help patients review their daily goal of treatment and discuss progress on daily workbooks.  Participation Level: Did Not Attend  Participation Quality:  Not Applicable  Affect:  Not Applicable  Cognitive:  Not Applicable  Insight:  Not Applicable  Engagement in Group: Not Applicable  Additional Comments:  The patient did not attend group since he remained asleep in his room.   Hazle CocaGOODMAN, Ardith Lewman S 04/18/2015, 8:53 PM

## 2015-04-18 NOTE — BHH Counselor (Signed)
Early morning (0530) admit; unwilling to participate in PSA at 10:10 AM. Writer to attempt later in the day. Carney Bernatherine C Emanii Bugbee, LCSW

## 2015-04-18 NOTE — ED Notes (Signed)
Bed: ON62WA10 Expected date:  Expected time:  Means of arrival:  Comments: EMS - 24yo M not feeling well
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
## 2015-04-18 NOTE — BH Assessment (Signed)
Received notification of TTS consult request. Spoke to Earley FavorGail Schulz, NP who said Pt says he is hallucinating, believes he is a vampire and using acid. Tele-assessment will be initiated.  Harlin RainFord Ellis Patsy BaltimoreWarrick Jr, LPC, [facility], [facility]: [facility] Triage Specialist [phone]

## 2015-04-18 NOTE — Progress Notes (Signed)
D [name] is restless, not engaged and enjoys using inappropriate language and behavior on the unit, to get attention. HE admits this when he is overheard saying to a patient...: " watch this...". His behavior is inappropriate as evidenced by him calling a 24 yo patient " you old stupid b----" . He can be manipulative, he is calculating and not engaged as evidenced by his statement " I'm not here to go to. Groups".   A He refused to complete his daily assessment . He refused to go ot groups and he refused to take his tegretol that is scheduled. He contracts with this nurse for safety and poc cont.   R Safety is in place and MD assessing for DC tomorrow.
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
## 2015-04-18 NOTE — ED Notes (Addendum)
Per EMS, pt. Was picked up from the Bus stop and reported of having hallucinations and and "not feeling well', pt. Stated that he is hearing voices  Telling  him that he is a ' Vampire' ,  pt. Also admitted to EMS that he ingested acid last night with unknown amount. Pt. Is alert and oriented x4, ambulatory upon arrival to ED, no s/s of distress , cooperative. No SOB . Denies SI nor HI.

## 2015-04-18 NOTE — Progress Notes (Signed)
D    Pt has isolated to his room and has not interacted with anybody   He has not requested any medications and has none due  In fact he has been refusing all psychiatric medications   He does not attend groups and is generally resistant to treatment A   Verbal support offered   Q 15 min checks R   Pt safe at present

## 2015-04-19 NOTE — Progress Notes (Signed)
Pt is declining follow-up appointments and declines release of information to any community facilities.   Chad CordialLauren Carter, Theresia MajorsLCSWA [phone]

## 2015-04-19 NOTE — Progress Notes (Signed)
  [facility] Discharge Plan :  Will you be returning to the same living situation after discharge:  No. [place] At discharge, do you have transportation home?: Yes,  Pt provided with bus passes and PART fare Do you have the ability to pay for your medications: Yes,  Pt provided with medication supply and prescriptions  Release of information consent forms completed and in the chart;  Patient's signature needed at discharge.  Patient to Follow up at: Follow-up Information    Please follow up.   Why:  Pt declines follow-up and declines release of information to community agencies for follow-up.      Patient denies SI/HI: Yes,  Pt denies    Safety Planning and Suicide Prevention discussed: Yes,  with Pt. See SPE note for further details  Have you used any form of tobacco in the last 30 days? (Cigarettes, Smokeless Tobacco, Cigars, and/or Pipes): Yes  Has patient been referred to the Quitline?: Patient refused referral  Elaina HoopsCarter, Eryc Bodey M 04/19/2015, 10:17 AM

## 2015-04-19 NOTE — Progress Notes (Signed)
Discharge note: Pt received both written and verbal discharge instructions. Pt verbal understanding of discharge summary. Pt denies suicidal thoughts at time of discharge. Pt received belongings from room and locker. Pt given two bus passes and funding for part bus.
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
## 2016-07-25 ENCOUNTER — Encounter (HOSPITAL_COMMUNITY): Payer: Self-pay | Admitting: *Deleted

## 2016-07-25 ENCOUNTER — Emergency Department (HOSPITAL_COMMUNITY)
Admission: EM | Admit: 2016-07-25 | Discharge: 2016-07-26 | Disposition: A | Discharge status: Home / Self Care | Payer: Self-pay

## 2016-07-25 DIAGNOSIS — Z79899 Other long term (current) drug therapy: Secondary | ICD-10-CM | POA: Insufficient documentation

## 2016-07-25 DIAGNOSIS — F329 Major depressive disorder, single episode, unspecified: Secondary | ICD-10-CM | POA: Insufficient documentation

## 2016-07-25 DIAGNOSIS — F17210 Nicotine dependence, cigarettes, uncomplicated: Secondary | ICD-10-CM | POA: Insufficient documentation

## 2016-07-25 DIAGNOSIS — R45851 Suicidal ideations: Secondary | ICD-10-CM | POA: Diagnosis present

## 2016-07-25 LAB — COMPREHENSIVE METABOLIC PANEL
ALT: 23 U/L (ref 17–63)
ANION GAP: 8 (ref 5–15)
AST: 24 U/L (ref 15–41)
Albumin: 4.6 g/dL (ref 3.5–5.0)
Alkaline Phosphatase: 47 U/L (ref 38–126)
BUN: 12 mg/dL (ref 6–20)
CHLORIDE: 103 mmol/L (ref 101–111)
CO2: 27 mmol/L (ref 22–32)
Calcium: 9.5 mg/dL (ref 8.9–10.3)
Creatinine, Ser: 1 mg/dL (ref 0.61–1.24)
GFR calc non Af Amer: >60 mL/min (ref >60)
Glucose, Bld: 103 mg/dL — ABNORMAL HIGH (ref 65–99)
POTASSIUM: 3.9 mmol/L (ref 3.5–5.1)
SODIUM: 138 mmol/L (ref 135–145)
Total Bilirubin: 0.8 mg/dL (ref 0.3–1.2)
Total Protein: 7.3 g/dL (ref 6.5–8.1)

## 2016-07-25 LAB — RAPID URINE DRUG SCREEN, HOSP PERFORMED
AMPHETAMINES: NOT DETECTED
Barbiturates: NOT DETECTED
Benzodiazepines: NOT DETECTED
COCAINE: NOT DETECTED
OPIATES: NOT DETECTED
TETRAHYDROCANNABINOL: POSITIVE — AB

## 2016-07-25 LAB — CBC
HCT: 48.2 % (ref 39.0–52.0)
HEMOGLOBIN: 16 g/dL (ref 13.0–17.0)
MCH: 30.8 pg (ref 26.0–34.0)
MCHC: 33.2 g/dL (ref 30.0–36.0)
MCV: 92.7 fL (ref 78.0–100.0)
PLATELETS: 262 10*3/uL (ref 150–400)
RBC: 5.2 MIL/uL (ref 4.22–5.81)
RDW: 13.1 % (ref 11.5–15.5)
WBC: 8.2 10*3/uL (ref 4.0–10.5)

## 2016-07-25 LAB — SALICYLATE LEVEL

## 2016-07-25 LAB — ACETAMINOPHEN LEVEL

## 2016-07-25 LAB — ETHANOL

## 2016-07-25 MED ORDER — NICOTINE 21 MG/24HR TD PT24: 21.0000 mg | MEDICATED_PATCH | Freq: Every day | TRANSDERMAL | Status: DC | PRN

## 2016-07-25 MED ORDER — ONDANSETRON HCL 4 MG PO TABS: 4.0000 mg | ORAL_TABLET | Freq: Three times a day (TID) | ORAL | Status: DC | PRN

## 2016-07-25 MED ORDER — ZOLPIDEM TARTRATE 5 MG PO TABS
5.0000 mg | ORAL_TABLET | Freq: Every evening | ORAL | Status: DC | PRN
Start: 2016-07-25 — End: 2016-07-26
  Administered 2016-07-26: 5 mg via ORAL
  Filled 2016-07-25: qty 1

## 2016-07-25 MED ORDER — ALUM & MAG HYDROXIDE-SIMETH 200-200-20 MG/5ML PO SUSP: 30.0000 mL | ORAL | Status: DC | PRN

## 2016-07-25 MED ORDER — ACETAMINOPHEN 325 MG PO TABS: 650.0000 mg | ORAL_TABLET | ORAL | Status: DC | PRN

## 2016-07-25 MED ORDER — IBUPROFEN 400 MG PO TABS: 600.0000 mg | ORAL_TABLET | Freq: Three times a day (TID) | ORAL | Status: DC | PRN

## 2016-07-25 MED ORDER — CLONAZEPAM 0.5 MG PO TABS
1.0000 mg | ORAL_TABLET | Freq: Three times a day (TID) | ORAL | Status: DC | PRN
  Administered 2016-07-26: 1 mg via ORAL
  Filled 2016-07-25: qty 2

## 2016-07-25 MED ORDER — DIVALPROEX SODIUM 250 MG PO DR TAB
500.0000 mg | DELAYED_RELEASE_TABLET | Freq: Two times a day (BID) | ORAL | Status: DC
Start: 2016-07-25 — End: 2016-07-26
  Administered 2016-07-26 (×2): 500 mg via ORAL
  Filled 2016-07-25 (×2): qty 2

## 2016-07-25 MED ORDER — LAMOTRIGINE 25 MG PO TABS
25.0000 mg | ORAL_TABLET | Freq: Two times a day (BID) | ORAL | Status: DC
  Administered 2016-07-26 (×2): 25 mg via ORAL
  Filled 2016-07-25 (×2): qty 1

## 2016-07-25 NOTE — ED Provider Notes (Signed)
[facility]-EMERGENCY DEPT Provider Note   CSN: [id number] Arrival date & time: 07/25/16  1825     History   Chief Complaint Chief Complaint  Patient presents with  . Suicidal    HPI [name] is a 25 y.o. male.  He presents for evaluation of suicidal ideation, related to the recent breakup with his girlfriend. He is very vague about history. He states that he walked here, but won't say where from. He states he has previously tried to commit suicide by taking medications, but can't remember when. He states that he is visiting, [place] from [place], [place], and staying with his mother. He denies using illegal drugs. He states he plans on returning to [place], but to be admitted to a psychiatric hospital. He denies recent illnesses. There are no other known modifying factors.  HPI  Past Medical History:  Diagnosis Date  . Anxiety   . Depression   . Mental disorder     Patient Active Problem List   Diagnosis Date Noted  . Substance induced mood disorder (HCC) 04/18/2015  . GAD (generalized anxiety disorder) 04/18/2015  . Major depressive disorder, recurrent episode, moderate (HCC)   . Major depressive disorder, recurrent episode, severe (HCC) 01/23/2015    Class: Acute  . Major depressive disorder, recurrent (HCC) 01/23/2015  . Severe major depression without psychotic features (HCC) 08/27/2014  . Ankle contusion 08/24/2014  . Adjustment disorder 08/24/2014  . Polysubstance (excluding opioids) dependence without physiological dependence (HCC) 09/22/2013  . 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 Suicide threat or attempt 09/22/2013  . PTSD (post-traumatic stress disorder) 09/22/2013  . Suicidal ideation 09/22/2013  . Major depressive disorder, recurrent episode, severe, specified as with psychotic behavior 09/17/2013  . Polysubstance dependence (HCC) 09/17/2013    History reviewed. No pertinent surgical history.     Home  Medications    Prior to Admission medications   Medication Sig Start Date End Date Taking? Authorizing Provider  acetaminophen (TYLENOL) 500 MG tablet Take 1,000 mg by mouth every 6 (six) hours as needed.   Yes Historical Provider, MD  clonazePAM (KLONOPIN) 1 MG tablet Take 1 mg by mouth 3 (three) times daily as needed for anxiety.   Yes Historical Provider, MD  divalproex (DEPAKOTE) 500 MG DR tablet Take 500 mg by mouth 2 (two) times daily. Takes 1 tab in am and 2 tabs in pm   Yes Historical Provider, MD  lamoTRIgine (LAMICTAL) 25 MG tablet Take 25 mg by mouth 2 (two) times daily.   Yes Historical Provider, MD    Family History History reviewed. No pertinent family history.  Social History Social History  Substance Use Topics  . Smoking status: Current Every Day Smoker    Packs/day: 0.50    Years: 11.00    Types: Cigarettes  . Smokeless tobacco: Not on file  . Alcohol use 14.4 oz/week    24 Cans of beer per week     Comment: case daily     Allergies   Trazodone and nefazodone   Review of Systems Review of Systems  All other systems reviewed and are negative.    Physical Exam Updated Vital Signs BP 124/86 (BP Location: Right Arm)   Pulse 74   Temp 98.2 F (36.8 C) (Oral)   Resp 16   Ht 5\' 9"  (1.753 m)   Wt 160 lb (72.6 kg)   SpO2 98%   BMI 23.63 kg/m   Physical Exam  Constitutional: He is oriented to  person, place, and time. He appears well-developed and well-nourished.  HENT:  Head: Normocephalic and atraumatic.  Right Ear: External ear normal.  Left Ear: External ear normal.  Eyes: Conjunctivae and EOM are normal. Pupils are equal, round, and reactive to light.  Neck: Normal range of motion and phonation normal. Neck supple.  Cardiovascular: Normal rate, regular rhythm and normal heart sounds.   Pulmonary/Chest: Effort normal and breath sounds normal. He exhibits no bony tenderness.  Abdominal: Soft. There is no tenderness.  Musculoskeletal: Normal range  of motion.  Neurological: He is alert and oriented to person, place, and time. No cranial nerve deficit or sensory deficit. He exhibits normal muscle tone. Coordination normal.  Skin: Skin is warm, dry and intact.  Psychiatric:  He appears depressed. Poor eye contact.  Nursing note and vitals reviewed.    ED Treatments / Results  Labs (all labs ordered are listed, but only abnormal results are displayed) Labs Reviewed  COMPREHENSIVE METABOLIC PANEL - Abnormal; Notable for the following:       Result Value   Glucose, Bld 103 (*)    All other components within normal limits  ACETAMINOPHEN LEVEL - Abnormal; Notable for the following:    Acetaminophen (Tylenol), Serum <10 (*)    All other components within normal limits  URINE RAPID DRUG SCREEN, HOSP PERFORMED - Abnormal; Notable for the following:    Tetrahydrocannabinol POSITIVE (*)    All other components within normal limits  ETHANOL  SALICYLATE LEVEL  CBC    EKG  EKG Interpretation None       Radiology No results found.  Procedures Procedures (including critical care time)  Medications Ordered in ED Medications  acetaminophen (TYLENOL) tablet 650 mg (not administered)  ibuprofen (ADVIL,MOTRIN) tablet 600 mg (not administered)  zolpidem (AMBIEN) tablet 5 mg (not administered)  nicotine (NICODERM CQ - dosed in mg/24 hours) patch 21 mg (not administered)  ondansetron (ZOFRAN) tablet 4 mg (not administered)  alum & mag hydroxide-simeth (MAALOX/MYLANTA) 200-200-20 MG/5ML suspension 30 mL (not administered)  divalproex (DEPAKOTE) DR tablet 500 mg (not administered)  lamoTRIgine (LAMICTAL) tablet 25 mg (not administered)  clonazePAM (KLONOPIN) tablet 1 mg (not administered)     Initial Impression / Assessment and Plan / ED Course  I have reviewed the triage vital signs and the nursing notes.  Pertinent labs & imaging results that were available during my care of the patient were reviewed by me and considered in my  medical decision making (see chart for details).  Clinical Course  Comment By Time  He is medically Cleared. Mancel Bale, MD 09/05 2315    Medications  acetaminophen (TYLENOL) tablet 650 mg (not administered)  ibuprofen (ADVIL,MOTRIN) tablet 600 mg (not administered)  zolpidem (AMBIEN) tablet 5 mg (not administered)  nicotine (NICODERM CQ - dosed in mg/24 hours) patch 21 mg (not administered)  ondansetron (ZOFRAN) tablet 4 mg (not administered)  alum & mag hydroxide-simeth (MAALOX/MYLANTA) 200-200-20 MG/5ML suspension 30 mL (not administered)  divalproex (DEPAKOTE) DR tablet 500 mg (not administered)  lamoTRIgine (LAMICTAL) tablet 25 mg (not administered)  clonazePAM (KLONOPIN) tablet 1 mg (not administered)    Patient Vitals for the past 24 hrs:  BP Temp Temp src Pulse Resp SpO2 Height Weight  07/25/16 1847 - - - - - - 5\' 9"  (1.753 m) 160 lb (72.6 kg)  07/25/16 1846 124/86 98.2 F (36.8 C) Oral 74 16 98 % - -    TTS consult    Final Clinical Impressions(s) / ED Diagnoses  Final diagnoses:  Suicidal ideation    SI with plan to OD on medications. Hx psychiatric illness and prior suicide attempt. He is voluntary.  Nursing Notes Reviewed/ Care Coordinated Applicable Imaging Reviewed Interpretation of Laboratory Data incorporated into ED treatment  Disposition- per TTS and oncoming provider team.  New Prescriptions New Prescriptions   No medications on file     Mancel BaleElliott Elorah Dewing, MD 07/26/16 1355

## 2016-07-25 NOTE — ED Notes (Signed)
Pt provided with lunch bag. Calm and cooperative at this time. Sitter at bedside

## 2016-07-25 NOTE — BH Assessment (Signed)
Clinician contacted pt RN [facility]) to request cart setup for TTS consult and was informed pt was in the process of switching rooms.

## 2016-07-25 NOTE — ED Triage Notes (Signed)
Pt states he is suicidal, plan to overdose on his medication. Pt denies ETOH/drug use today. Pt states he recently broke up with his girlfriend and just recently got charged with a felony in [place]. Pt denies auditory/visual hallucinations. Pt calm and cooperative.

## 2016-07-26 ENCOUNTER — Encounter (HOSPITAL_COMMUNITY): Payer: Self-pay | Admitting: *Deleted

## 2016-07-26 ENCOUNTER — Emergency Department (HOSPITAL_COMMUNITY)
Admission: EM | Admit: 2016-07-26 | Discharge: 2016-07-27 | Disposition: A | Discharge status: Home / Self Care | Payer: No Typology Code available for payment source | Attending: Emergency Medicine | Admitting: Emergency Medicine

## 2016-07-26 DIAGNOSIS — F17210 Nicotine dependence, cigarettes, uncomplicated: Secondary | ICD-10-CM | POA: Insufficient documentation

## 2016-07-26 DIAGNOSIS — R45851 Suicidal ideations: Secondary | ICD-10-CM

## 2016-07-26 DIAGNOSIS — F4321 Adjustment disorder with depressed mood: Secondary | ICD-10-CM | POA: Insufficient documentation

## 2016-07-26 DIAGNOSIS — Z79899 Other long term (current) drug therapy: Secondary | ICD-10-CM | POA: Insufficient documentation

## 2016-07-26 LAB — VALPROIC ACID LEVEL: VALPROIC ACID LVL: 62 ug/mL (ref 50.0–100.0)

## 2016-07-26 MED ORDER — IBUPROFEN 200 MG PO TABS: 600.0000 mg | ORAL_TABLET | Freq: Three times a day (TID) | ORAL | Status: DC | PRN

## 2016-07-26 MED ORDER — LORAZEPAM 1 MG PO TABS: 1.0000 mg | ORAL_TABLET | Freq: Three times a day (TID) | ORAL | Status: DC | PRN

## 2016-07-26 MED ORDER — DIVALPROEX SODIUM 500 MG PO DR TAB
500.0000 mg | DELAYED_RELEASE_TABLET | Freq: Two times a day (BID) | ORAL | Status: DC
  Administered 2016-07-26 – 2016-07-27 (×2): 500 mg via ORAL
  Filled 2016-07-26 (×2): qty 1

## 2016-07-26 MED ORDER — LAMOTRIGINE 25 MG PO TABS
25.0000 mg | ORAL_TABLET | Freq: Two times a day (BID) | ORAL | Status: DC
  Administered 2016-07-26 – 2016-07-27 (×2): 25 mg via ORAL
  Filled 2016-07-26 (×2): qty 1

## 2016-07-26 MED ORDER — ACETAMINOPHEN 325 MG PO TABS: 650.0000 mg | ORAL_TABLET | ORAL | Status: DC | PRN

## 2016-07-26 NOTE — Discharge Instructions (Signed)
Follow-up as per behavioral health. °

## 2016-07-26 NOTE — BH Assessment (Addendum)
Tele Assessment Note   [name] is an 25 y.o. male presenting voluntarily with complaints of suicidal ideation with plan, intent and access of overdosing on medication. Pt [name] he is depressed because his girlfriend broke up with him.  Pt presents as guarded, providing minimal responses. Pt reports history of multiple inpatient admissions and suicide attempts. Pt reports history of bipolar disorder, depression and anxiety. Pt is not currently compliant with all medications. Pt reports history of self-harm (cutting). Pt [name] he last cut "a while ago". Pt reports use of THC and denies any additional substance use.   Pt denies hallucinations and thoughts of harm towards others. Pt reports recently being charged with felony burglary in [place]. Pt [name] he returned to [place] (1wk.ago) prior to receiving information regarding court dates.  Pt UDS +THC  Diagnosis: F31.9 Bipolar d/o, unspecified F41.1 Generalized anxiety disorder   Past Medical History:  Past Medical History:  Diagnosis Date  . Anxiety   . Depression   . Mental disorder     History reviewed. No pertinent surgical history.  Family History: History reviewed. No pertinent family history.  Social History:  reports that he has been smoking Cigarettes.  He has a 5.50 pack-year smoking history. He does not have any smokeless tobacco history on file. He reports that he drinks about 14.4 oz of alcohol per week . He reports that he uses drugs, including LSD and Marijuana.  Additional Social History:  Alcohol / Drug Use Pain Medications: Pt denies abuse. Prescriptions: Pt denies abuse. Over the Counter: Pt denies abuse. History of alcohol / drug use?: Yes Longest period of sobriety (when/how long): unknown Negative Consequences of Use:  (None Reported) Substance #1 Name of Substance 1: THC 1 - Age of First Use: Not Reported 1 - Amount (size/oz): Not Reported 1 - Frequency: "every couple of days" 1 - Duration:  ongoing x1 wk 1 - Last Use / Amount: 2 days ago/ not reported  CIWA: CIWA-Ar BP: 124/86 Pulse Rate: 74 COWS:    PATIENT STRENGTHS: (choose at least two) Average or above average intelligence Capable of independent living  Allergies:  Allergies  Allergen Reactions  . Trazodone And Nefazodone Other (See Comments)    Lethargic next morning, dizziness, confusion    Home Medications:  (Not in a hospital admission)  OB/GYN Status:  No LMP for male patient.  General Assessment Data Location of Assessment: [facility] ED TTS Assessment: In system Is this a Tele or Face-to-Face Assessment?: Tele Assessment Is this an Initial Assessment or a Re-assessment for this encounter?: Initial Assessment Marital status: Single Is patient pregnant?: No Pregnancy Status: No Living Arrangements: Parent (Mother) Can pt return to current living arrangement?: Yes Admission Status: Voluntary Is patient capable of signing voluntary admission?: Yes Referral Source: Self/Family/Friend Insurance type: Self-Pay     Crisis Care Plan Living Arrangements: Parent (Mother) Name of Psychiatrist: None Name of Therapist: None  Education Status Is patient currently in school?: No Highest grade of school patient has completed: 12th  Risk to self with the past 6 months Suicidal Ideation: Yes-Currently Present Has patient been a risk to self within the past 6 months prior to admission? : No Suicidal Intent: Yes-Currently Present Has patient had any suicidal intent within the past 6 months prior to admission? : No Is patient at risk for suicide?: Yes Suicidal Plan?: Yes-Currently Present Has patient had any suicidal plan within the past 6 months prior to admission? : No Specify Current Suicidal Plan: Overdose on medication  Access to Means: Yes Specify Access to Suicidal Means: Access to medication What has been your use of drugs/alcohol within the last 12 months?: Pt reports THC use. Previous  Attempts/Gestures: Yes How many times?: 2 Other Self Harm Risks: h/o suicide attempts and cutting Triggers for Past Attempts: Unpredictable Intentional Self Injurious Behavior: Cutting Comment - Self Injurious Behavior: Pt reports last cut "a while ago" Family Suicide History: Unknown Recent stressful life event(s): Other (Comment) ("my girlfriend broke up with me") Persecutory voices/beliefs?: No Depression: Yes Depression Symptoms: Insomnia, Tearfulness, Isolating, Fatigue, Guilt, Feeling worthless/self pity, Feeling angry/irritable, Loss of interest in usual pleasures Substance abuse history and/or treatment for substance abuse?: Yes Suicide prevention information given to non-admitted patients: Not applicable  Risk to Others within the past 6 months Homicidal Ideation: No Does patient have any lifetime risk of violence toward others beyond the six months prior to admission? : No Thoughts of Harm to Others: No Current Homicidal Intent: No Current Homicidal Plan: No Access to Homicidal Means: No History of harm to others?: Yes (pt reports h/o aggression towards others "occasionally") Assessment of Violence: None Noted Violent Behavior Description: None Reported Does patient have access to weapons?: Yes (Comment) ("knives and shit") Criminal Charges Pending?: Yes Does patient have a court date:  (pt unsure) Is patient on probation?: No  Psychosis Hallucinations: None noted Delusions: None noted  Mental Status Report Appearance/Hygiene: In scrubs (pt in scrubs and ballcap) Eye Contact: Poor Motor Activity: Unremarkable Speech: Logical/coherent Level of Consciousness: Alert, Other (Comment) (guarded) Mood: Irritable Affect: Irritable Anxiety Level: None Thought Processes: Coherent, Relevant Judgement: Unimpaired Orientation: Person, Place, Time, Situation Obsessive Compulsive Thoughts/Behaviors: None  Cognitive Functioning Concentration: Normal Memory: Recent Intact,  Remote Intact IQ: Average Insight: Fair Impulse Control: Fair Appetite: Fair Weight Loss: 0 Weight Gain: 0 Sleep: Decreased Total Hours of Sleep: 3 Vegetative Symptoms: Unable to Assess  ADLScreening [facility] Assessment Services) Patient's cognitive ability adequate to safely complete daily activities?: Yes Patient able to express need for assistance with ADLs?: Yes Independently performs ADLs?: Yes (appropriate for developmental age)  Prior Inpatient Therapy Prior Inpatient Therapy: Yes Prior Therapy Dates: Multiple, last [facility] inpatient 5.2016 Prior Therapy Facilty/Provider(s): [facility] Reason for Treatment: substance use, depression  Prior Outpatient Therapy Prior Outpatient Therapy: Yes Prior Therapy Dates: Ongoing Prior Therapy Facilty/Provider(s): Provider located in [place] (pt arrived in [place] 1 wk. ago) Reason for Treatment: bipolar d/o Does patient have an ACCT team?: No Does patient have Intensive In-House Services?  : No Does patient have [facility] services? : No Does patient have P4CC services?: No  ADL Screening (condition at time of admission) Patient's cognitive ability adequate to safely complete daily activities?: Yes Is the patient deaf or have difficulty hearing?: No Does the patient have difficulty seeing, even when wearing glasses/contacts?: No Does the patient have difficulty concentrating, remembering, or making decisions?: No Patient able to express need for assistance with ADLs?: Yes Does the patient have difficulty dressing or bathing?: No Independently performs ADLs?: Yes (appropriate for developmental age) Does the patient have difficulty walking or climbing stairs?: No Weakness of Legs: None Weakness of Arms/Hands: None  Home Assistive Devices/Equipment Home Assistive Devices/Equipment: None  Therapy Consults (therapy consults require a physician order) PT Evaluation Needed: No OT Evalulation Needed: No Abuse/Neglect Assessment (Assessment to be  complete while patient is alone) Physical Abuse: Denies Verbal Abuse: Denies Sexual Abuse: Denies Exploitation of patient/patient's resources: Denies Self-Neglect: Denies Values / Beliefs Cultural Requests During Hospitalization: None Spiritual Requests During Hospitalization: None Consults  Spiritual Care Consult Needed: No Social Work Consult Needed: No Merchant navy officerAdvance Directives (For Healthcare) Does patient have an advance directive?: No Would patient like information on creating an advanced directive?: No - patient declined information    Additional Information 1:1 In Past 12 Months?: No [name] Risk: No Elopement Risk: No Does patient have medical clearance?: Yes     Disposition: Per Donell SievertSpencer Simon, PA pt meets criteria for inpatient admission. Clinician confirmed lack of [facility] bed availability with Clint Bolderori Beck, AC. TTS to seek placement. Marquita PalmsMario, RN informed of pt disposition.  Disposition Initial Assessment Completed for this Encounter: Yes Disposition of Patient: Other dispositions Other disposition(s): Other (Comment) (Pending Psychiatric Recommendation)  Brayli Klingbeil J Swazil[place]Jordan 07/26/2016 12:38 AM

## 2016-07-26 NOTE — BH Assessment (Signed)
Assessor attempted to complete TTS assessment but pt is being administered medication at this time per Rudi CocoVanita, Charity fundraiserN . Will call back in 5 minutes at RN's request to complete assessment.   Princess BruinsAquicha Duff, MSW, Theresia MajorsLCSWA

## 2016-07-26 NOTE — ED Notes (Signed)
Bed: WTR5 Expected date:  Expected time:  Means of arrival:  Comments: 

## 2016-07-26 NOTE — Progress Notes (Signed)
[facility] with a plan to "take all my Depakote."  Introduced self to pt.  Actively listened to pt and offered support and encouragement.  Pt reports "my girlfriend broke up with me."  He denies medical problems.  Denies drug and alcohol use.  He verbally contracts for safety while in [facility].  Pt denies HI, denies hallucinations, denies pain.  [facility]  Pt was provided with meal and fluids.  He denies further needs at this time.  Will continue to monitor and assess.

## 2016-07-26 NOTE — ED Notes (Signed)
Pt refused to allow sitter or RN to obtain V/S at this time. Pt presents as sleepy and doesn't make eye contact.   Verbally agreed to allow V/S when breakfast came.

## 2016-07-26 NOTE — ED Notes (Signed)
Pt being reevaluated by TTS.  

## 2016-07-26 NOTE — Consult Note (Signed)
Telepsych Consultation   Reason for Consult:  Psych Referring Physician: Dr. Eulis Foster Patient Identification: [name] MRN:  [id number] Principal Diagnosis: Suicidal ideation Diagnosis:   Patient Active Problem List   Diagnosis Date Noted  . Substance induced mood disorder ([place]) [F19.94] 04/18/2015  . GAD (generalized anxiety disorder) [F41.1] 04/18/2015  . Major depressive disorder, recurrent episode, moderate (HCC) [F33.1]   . Major depressive disorder, recurrent episode, severe ([place]) [F33.2] 01/23/2015    Class: Acute  . Major depressive disorder, recurrent ([place]) [F33.9] 01/23/2015  . Severe major depression without psychotic features ([place]) [F32.2] 08/27/2014  . Ankle contusion [S90.00XA] 08/24/2014  . Adjustment disorder [F43.20] 08/24/2014  . Polysubstance (excluding opioids) dependence without physiological dependence ([place]) [F19.20] 09/22/2013  . 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 Suicide threat or attempt [F48.9] 09/22/2013  . PTSD (post-traumatic stress disorder) [F43.10] 09/22/2013  . Suicidal ideation [R45.851] 09/22/2013  . Major depressive disorder, recurrent episode, severe, specified as with psychotic behavior [F33.3] 09/17/2013  . Polysubstance dependence ([place]) [F19.20] 09/17/2013    Total Time spent with patient: 30 minutes  Subjective:   [name] is a 25 y.o. male patient admitted with suicidal ideation with a plan to overdose on medication. Patient reports recent break-up with his girlfriend and she is willing to work things out with him. He reports the two were arguing at the time, and things escalated. I was upset last night but I talked to her we are going to work things out, and I have kids to live for so I no longer want to hurt myself. I seen the psychiatrist on 07/21/2016 before I came down here, and refilled my medications. I am telling you I am ok, I was just upset.   HPI:  [name]  is an 25 y.o. male presenting voluntarily with complaints of suicidal ideation with plan, intent and access of overdosing on medication. Pt states he is depressed because his girlfriend broke up with him.  Pt presents as guarded, providing minimal responses. Pt reports history of multiple inpatient admissions and suicide attempts. Pt reports history of bipolar disorder, depression and anxiety. Pt is not currently compliant with all medications. Pt reports history of self-harm (cutting). Pt states he last cut "a while ago". Pt reports use of THC and denies any additional substance use.   Pt denies hallucinations and thoughts of harm towards others. Pt reports recently being charged with felony burglary in [place]. Pt states he returned to [place] (1wk.ago) prior to receiving information regarding court dates.  Pt UDS +THC  Past Psychiatric History: Bipolar D/o, Generalized Anxiety  Risk to Self: Suicidal Ideation: Yes-Currently Present Suicidal Intent: Yes-Currently Present Is patient at risk for suicide?: Yes Suicidal Plan?: Yes-Currently Present Specify Current Suicidal Plan: Overdose on medication Access to Means: Yes Specify Access to Suicidal Means: Access to medication What has been your use of drugs/alcohol within the last 12 months?: Pt reports THC use. How many times?: 2 Other Self Harm Risks: h/o suicide attempts and cutting Triggers for Past Attempts: Unpredictable Intentional Self Injurious Behavior: Cutting Comment - Self Injurious Behavior: Pt reports last cut "a while ago" Risk to Others: Homicidal Ideation: No Thoughts of Harm to Others: No Current Homicidal Intent: No Current Homicidal Plan: No Access to Homicidal Means: No History of harm to others?: Yes (pt reports h/o aggression towards others "occasionally") Assessment of Violence: None Noted Violent Behavior Description: None Reported Does patient have access to weapons?: Yes (Comment) ("knives  and  shit") Criminal Charges Pending?: Yes Does patient have a court date:  (pt unsure) Prior Inpatient Therapy: Prior Inpatient Therapy: Yes Prior Therapy Dates: Multiple, last [facility] inpatient 5.2016 Prior Therapy Facilty/Provider(s): [facility] Reason for Treatment: substance use, depression Prior Outpatient Therapy: Prior Outpatient Therapy: Yes Prior Therapy Dates: Ongoing Prior Therapy Facilty/Provider(s): Provider located in [place] (pt arrived in [place] 1 wk. ago) Reason for Treatment: bipolar d/o Does patient have an ACCT team?: No Does patient have Intensive In-House Services?  : No Does patient have [facility] services? : No Does patient have P4CC services?: No  Past Medical History:  Past Medical History:  Diagnosis Date  . Anxiety   . Depression   . Mental disorder    History reviewed. No pertinent surgical history. Family History: History reviewed. No pertinent family history. Family Psychiatric  History: Denies Social History:  History  Alcohol Use  . 14.4 oz/week  . 24 Cans of beer per week    Comment: case daily     History  Drug Use  . Types: LSD, Marijuana    Comment: reports quitting alcohol and drugs 6 months ago    Social History   Social History  . Marital status: Single    Spouse name: N/A  . Number of children: N/A  . Years of education: N/A   Social History Main Topics  . Smoking status: Current Every Day Smoker    Packs/day: 0.50    Years: 11.00    Types: Cigarettes  . Smokeless tobacco: None  . Alcohol use 14.4 oz/week    24 Cans of beer per week     Comment: case daily  . Drug use:     Types: LSD, Marijuana     Comment: reports quitting alcohol and drugs 6 months ago  . Sexual activity: Yes    Birth control/ protection: None   Other Topics Concern  . None   Social History Narrative  . None   Additional Social History:    Allergies:   Allergies  Allergen Reactions  . Trazodone And Nefazodone Other (See Comments)    Lethargic next  morning, dizziness, confusion    Labs:  Results for orders placed or performed during the hospital encounter of 07/25/16 (from the past 48 hour(s))  Comprehensive metabolic panel     Status: Abnormal   Collection Time: 07/25/16  6:48 PM  Result Value Ref Range   Sodium 138 135 - 145 mmol/L   Potassium 3.9 3.5 - 5.1 mmol/L   Chloride 103 101 - 111 mmol/L   CO2 27 22 - 32 mmol/L   Glucose, Bld 103 (H) 65 - 99 mg/dL   BUN 12 6 - 20 mg/dL   Creatinine, Ser 3.81 0.61 - 1.24 mg/dL   Calcium 9.5 8.9 - 87.6 mg/dL   Total Protein 7.3 6.5 - 8.1 g/dL   Albumin 4.6 3.5 - 5.0 g/dL   AST 24 15 - 41 U/L   ALT 23 17 - 63 U/L   Alkaline Phosphatase 47 38 - 126 U/L   Total Bilirubin 0.8 0.3 - 1.2 mg/dL   GFR calc non Af Amer >60 >60 mL/min   GFR calc Af Amer >60 >60 mL/min    Comment: (NOTE) The eGFR has been calculated using the CKD EPI equation. This calculation has not been validated in all clinical situations. eGFR's persistently <60 mL/min signify possible Chronic Kidney Disease.    Anion gap 8 5 - 15  Ethanol     Status: None  Collection Time: 07/25/16  6:48 PM  Result Value Ref Range   Alcohol, Ethyl (B) <5 <5 mg/dL    Comment:        LOWEST DETECTABLE LIMIT FOR SERUM ALCOHOL IS 5 mg/dL FOR MEDICAL PURPOSES ONLY   Salicylate level     Status: None   Collection Time: 07/25/16  6:48 PM  Result Value Ref Range   Salicylate Lvl <4.0 2.8 - 30.0 mg/dL  Acetaminophen level     Status: Abnormal   Collection Time: 07/25/16  6:48 PM  Result Value Ref Range   Acetaminophen (Tylenol), Serum <10 (L) 10 - 30 ug/mL    Comment:        THERAPEUTIC CONCENTRATIONS VARY SIGNIFICANTLY. A RANGE OF 10-30 ug/mL MAY BE AN EFFECTIVE CONCENTRATION FOR MANY PATIENTS. HOWEVER, SOME ARE BEST TREATED AT CONCENTRATIONS OUTSIDE THIS RANGE. ACETAMINOPHEN CONCENTRATIONS >150 ug/mL AT 4 HOURS AFTER INGESTION AND >50 ug/mL AT 12 HOURS AFTER INGESTION ARE OFTEN ASSOCIATED WITH TOXIC REACTIONS.   cbc      Status: None   Collection Time: 07/25/16  6:48 PM  Result Value Ref Range   WBC 8.2 4.0 - 10.5 K/uL   RBC 5.20 4.22 - 5.81 MIL/uL   Hemoglobin 16.0 13.0 - 17.0 g/dL   HCT 48.2 39.0 - 52.0 %   MCV 92.7 78.0 - 100.0 fL   MCH 30.8 26.0 - 34.0 pg   MCHC 33.2 30.0 - 36.0 g/dL   RDW 13.1 11.5 - 15.5 %   Platelets 262 150 - 400 K/uL  Rapid urine drug screen (hospital performed)     Status: Abnormal   Collection Time: 07/25/16  6:48 PM  Result Value Ref Range   Opiates NONE DETECTED NONE DETECTED   Cocaine NONE DETECTED NONE DETECTED   Benzodiazepines NONE DETECTED NONE DETECTED   Amphetamines NONE DETECTED NONE DETECTED   Tetrahydrocannabinol POSITIVE (A) NONE DETECTED   Barbiturates NONE DETECTED NONE DETECTED    Comment:        DRUG SCREEN FOR MEDICAL PURPOSES ONLY.  IF CONFIRMATION IS NEEDED FOR ANY PURPOSE, NOTIFY LAB WITHIN 5 DAYS.        LOWEST DETECTABLE LIMITS FOR URINE DRUG SCREEN Drug Class       Cutoff (ng/mL) Amphetamine      1000 Barbiturate      200 Benzodiazepine   981 Tricyclics       191 Opiates          300 Cocaine          300 THC              50     Current Facility-Administered Medications  Medication Dose Route Frequency Provider Last Rate Last Dose  . acetaminophen (TYLENOL) tablet 650 mg  650 mg Oral Q4H PRN Daleen Bo, MD      . alum & mag hydroxide-simeth (MAALOX/MYLANTA) 200-200-20 MG/5ML suspension 30 mL  30 mL Oral PRN Daleen Bo, MD      . clonazePAM Bobbye [place]) tablet 1 mg  1 mg Oral TID PRN Daleen Bo, MD   1 mg at 07/26/16 1026  . divalproex (DEPAKOTE) DR tablet 500 mg  500 mg Oral BID Daleen Bo, MD   500 mg at 07/26/16 1024  . ibuprofen (ADVIL,MOTRIN) tablet 600 mg  600 mg Oral Q8H PRN Daleen Bo, MD      . lamoTRIgine (LAMICTAL) tablet 25 mg  25 mg Oral BID Daleen Bo, MD   25 mg at 07/26/16 1024  .  nicotine (NICODERM CQ - dosed in mg/24 hours) patch 21 mg  21 mg Transdermal Daily PRN Daleen Bo, MD      . ondansetron  [facility]) tablet 4 mg  4 mg Oral Q8H PRN Daleen Bo, MD      . zolpidem (AMBIEN) tablet 5 mg  5 mg Oral QHS PRN Daleen Bo, MD   5 mg at 07/26/16 4580   Current Outpatient Prescriptions  Medication Sig Dispense Refill  . acetaminophen (TYLENOL) 500 MG tablet Take 1,000 mg by mouth every 6 (six) hours as needed.    . clonazePAM (KLONOPIN) 1 MG tablet Take 1 mg by mouth 3 (three) times daily as needed for anxiety.    . divalproex (DEPAKOTE) 500 MG DR tablet Take 500 mg by mouth 2 (two) times daily. Takes 1 tab in am and 2 tabs in pm    . lamoTRIgine (LAMICTAL) 25 MG tablet Take 25 mg by mouth 2 (two) times daily.      Musculoskeletal: Strength & Muscle Tone: within normal limits Gait & Station: normal Patient leans: N/A  Psychiatric Specialty Exam: Physical Exam  ROS  Blood pressure 113/74, pulse (!) 59, temperature 98.4 F (36.9 C), temperature source Oral, resp. rate 16, height '5\' 9"'$  (1.753 m), weight 72.6 kg (160 lb), SpO2 100 %.Body mass index is 23.63 kg/m.  General Appearance: Fairly Groomed  Eye Contact:  Fair  Speech:  Clear and Coherent and Normal Rate  Volume:  Normal  Mood:  Euthymic  Affect:  Appropriate and Congruent  Thought Process:  Goal Directed  Orientation:  Full (Time, Place, and Person)  Thought Content:  WDL  Suicidal Thoughts:  No  Homicidal Thoughts:  No  Memory:  Immediate;   Fair  Judgement:  Intact  Insight:  Fair and Present  Psychomotor Activity:  Normal  Concentration:  Concentration: Fair and Attention Span: Fair  Recall:  [employer] of Knowledge:  Fair  Language:  Fair  Akathisia:  No  Handed:  Right  AIMS (if indicated):     Assets:  Communication Skills Desire for Improvement Financial Resources/Insurance Housing Intimacy Leisure Time Physical Health Social Support Vocational/Educational  ADL's:  Intact  Cognition:  WNL  Sleep:      Treatment Plan Summary: Daily contact with patient to assess and evaluate symptoms and  progress in treatment, Medication management and Plan No harm safety contract. Patient last seen by pscyh MD  on 07/21/2016, he reports compliance with all meds listed above. He is working in [employer], [employer] and caught the train down to visit his family before he turns himself in for the felony he obtained. he denies SI/HI/AVH at this time. Reports no recent episodes of self harm injuries or suicide attempts.   Disposition: No evidence of imminent risk to self or others at present.   Patient does not meet criteria for psychiatric inpatient admission. Supportive therapy provided about ongoing stressors. Discussed crisis plan, support from social network, calling 911, coming to the Emergency Department, and calling Suicide Hotline.  Nanci Pina, FNP 07/26/2016 12:19 PM

## 2016-07-26 NOTE — ED Provider Notes (Signed)
[facility]-EMERGENCY DEPT Provider Note   CSN: [id number] Arrival date & time: 07/26/16  1843     History   Chief Complaint Chief Complaint  Patient presents with  . Suicidal    HPI [name] is a 25 y.o. male.  The history is provided by the patient.  Patient presents for depression with suicidal thoughts. He was seen yesterday at [facility] for the same. States he was still suicidal but told his evaluator there that he was not. States he talked his way out of the admission but would like to admission now. He is from [place] and is facing some felony charges up there. Denies substance abuse but states he does use marijuana. States his planned overdose would be to overdose on his Depakote. Seen by psychiatry around 7 hours ago and cleared. States he lied so he could leave. Denies homicidal thoughts. Denies hallucinations.  Past Medical History:  Diagnosis Date  . Anxiety   . Depression   . Mental disorder     Patient Active Problem List   Diagnosis Date Noted  . Substance induced mood disorder (HCC) 04/18/2015  . GAD (generalized anxiety disorder) 04/18/2015  . Major depressive disorder, recurrent episode, moderate (HCC)   . Major depressive disorder, recurrent episode, severe (HCC) 01/23/2015    Class: Acute  . Major depressive disorder, recurrent (HCC) 01/23/2015  . Severe major depression without psychotic features (HCC) 08/27/2014  . Ankle contusion 08/24/2014  . Adjustment disorder 08/24/2014  . Polysubstance (excluding opioids) dependence without physiological dependence (HCC) 09/22/2013  . 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 Suicide threat or attempt 09/22/2013  . PTSD (post-traumatic stress disorder) 09/22/2013  . Suicidal ideation 09/22/2013  . Major depressive disorder, recurrent episode, severe, specified as with psychotic behavior 09/17/2013  . Polysubstance dependence (HCC) 09/17/2013    History  reviewed. No pertinent surgical history.     Home Medications    Prior to Admission medications   Medication Sig Start Date End Date Taking? Authorizing Provider  acetaminophen (TYLENOL) 500 MG tablet Take 1,000 mg by mouth every 6 (six) hours as needed.    Historical Provider, MD  clonazePAM (KLONOPIN) 1 MG tablet Take 1 mg by mouth 3 (three) times daily as needed for anxiety.    Historical Provider, MD  divalproex (DEPAKOTE) 500 MG DR tablet Take 500 mg by mouth 2 (two) times daily. Takes 1 tab in am and 2 tabs in pm    Historical Provider, MD  lamoTRIgine (LAMICTAL) 25 MG tablet Take 25 mg by mouth 2 (two) times daily.    Historical Provider, MD    Family History No family history on file.  Social History Social History  Substance Use Topics  . Smoking status: Current Every Day Smoker    Packs/day: 0.50    Years: 11.00    Types: Cigarettes  . Smokeless tobacco: Never Used  . Alcohol use 14.4 oz/week    24 Cans of beer per week     Comment: case daily     Allergies   Trazodone and nefazodone   Review of Systems Review of Systems  Constitutional: Negative for appetite change.  HENT: Negative for congestion.   Eyes: Negative for photophobia.  Respiratory: Negative for shortness of breath.   Cardiovascular: Negative for chest pain.  Gastrointestinal: Negative for abdominal pain.  Genitourinary: Negative for flank pain.  Musculoskeletal: Negative for back pain.  Neurological: Negative for seizures.  Psychiatric/Behavioral: Positive for dysphoric mood and suicidal  ideas.     Physical Exam Updated Vital Signs BP 130/75 (BP Location: Left Arm)   Pulse 85   Temp 98.3 F (36.8 C) (Oral)   Resp 18   SpO2 100%   Physical Exam  Constitutional: He appears well-developed.  HENT:  Patient is to lower lip piercings  Cardiovascular: Normal rate.   Pulmonary/Chest: Effort normal.  Abdominal: Soft.  Musculoskeletal: Normal range of motion.  Neurological: He is  alert.  Skin: Skin is warm.  Psychiatric: He has a normal mood and affect.     ED Treatments / Results  Labs (all labs ordered are listed, but only abnormal results are displayed) Labs Reviewed  VALPROIC ACID LEVEL    EKG  EKG Interpretation None       Radiology No results found.  Procedures Procedures (including critical care time)  Medications Ordered in ED Medications  acetaminophen (TYLENOL) tablet 650 mg (not administered)  ibuprofen (ADVIL,MOTRIN) tablet 600 mg (not administered)  LORazepam (ATIVAN) tablet 1 mg (not administered)  divalproex (DEPAKOTE) DR tablet 500 mg (not administered)  lamoTRIgine (LAMICTAL) tablet 25 mg (not administered)     Initial Impression / Assessment and Plan / ED Course  I have reviewed the triage vital signs and the nursing notes.  Pertinent labs & imaging results that were available during my care of the patient were reviewed by me and considered in my medical decision making (see chart for details).  Clinical Course    Patient presents with suicidal thoughts. Cleared by psychiatry 7 hours ago. States that he lied to them in order to leave. Reviewed labs from yesterday. Will only add Depakote level at this time. Medically cleared and to be seen by TTS.  Final Clinical Impressions(s) / ED Diagnoses   Final diagnoses:  Suicidal ideation    New Prescriptions New Prescriptions   No medications on file     Benjiman CoreNathan Kirstan Fentress, MD 07/26/16 1955

## 2016-07-26 NOTE — BH Assessment (Signed)
[facility]; [place]; [place]; [facility]; [address] and [place].  Princess BruinsAquicha Duff, MSW, Theresia MajorsLCSWA

## 2016-07-26 NOTE — ED Notes (Signed)
Pt denies ST at this time.  States he would like to be reevaluated so he can be discharged.  Notified TTS.

## 2016-07-26 NOTE — BH Assessment (Signed)
Spoke with Marshia LyBrandon Church, RN, TTS will be set up in pt's room.   Princess BruinsAquicha Duff, MSW, Theresia MajorsLCSWA

## 2016-07-26 NOTE — ED Notes (Signed)
Bed: [facility] Expected date:  Expected time:  Means of arrival:  Comments: Triage 5 

## 2016-07-26 NOTE — BH Assessment (Signed)
TTS consult request received prior to EDP note being entered in chart. Writer contacted RN to request consult order be discontinued and resubmitted upon note being entered.

## 2016-07-26 NOTE — BH Assessment (Signed)
Tele Assessment Note   [name] is an 25 y.o. male who presents to the ED voluntarily. Pt reports he was feeling suicidal and had a plan to overdose on his prescription medications. Pt reports he came to the ED on 07/25/16 with the same feelings but "talked his way out of it" because he was ready to go. Pt states his constant conflict with his girlfriend is his primary stressor and he engages in a depressed mood for most of the day, unable to sleep, feeling worthless, lost of interest in usual activities, hopeless, and excessive crying for hours at a time. Pt reports he has been hospitalized in the past due to depression and S/I. Pt denies any H/I or A/V hallucinations. Pt admits to using marijuana as recent as 3 days prior.   Pt denies any former physical or emotional abuse and states "if I go home, I don't think I will be safe." Pt reports he is still actively having suicidal thoughts. Pt meets inpatient criteria per Donell Sievert, PA . No appropriate beds at [facility] at this time. Pt will be referred out.    Diagnosis: Major Depressive Disorder   Past Medical History:  Past Medical History:  Diagnosis Date   Anxiety    Depression    Mental disorder     History reviewed. No pertinent surgical history.  Family History: No family history on file.  Social History:  reports that he has been smoking Cigarettes.  He has a 5.50 pack-year smoking history. He has never used smokeless tobacco. He reports that he drinks about 14.4 oz of alcohol per week . He reports that he uses drugs, including LSD and Marijuana.  Additional Social History:  Alcohol / Drug Use Pain Medications: Pt denies abuse. Prescriptions: Pt denies abuse but threatened to abuse due to S/I.Marland Kitchen Over the Counter: Pt denies abuse. History of alcohol / drug use?: Yes Longest period of sobriety (when/how long): unknown Substance #1 Name of Substance 1: Marijuana  1 - Age of First Use: 10 1 - Amount (size/oz): unknown 1 -  Frequency: weekly 1 - Duration: ongoing x1 wk 1 - Last Use / Amount: 3 days prior   CIWA: CIWA-Ar BP: 130/75 Pulse Rate: 85 COWS:    PATIENT STRENGTHS: (choose at least two) Average or above average intelligence Communication skills Motivation for treatment/growth  Allergies:  Allergies  Allergen Reactions   Trazodone And Nefazodone Other (See Comments)    Lethargic next morning, dizziness, confusion    Home Medications:  (Not in a hospital admission)  OB/GYN Status:  No LMP for male patient.  General Assessment Data Location of Assessment: [facility] ED TTS Assessment: In system Is this a Tele or Face-to-Face Assessment?: Tele Assessment Is this an Initial Assessment or a Re-assessment for this encounter?: Initial Assessment Marital status: Divorced Is patient pregnant?: No Pregnancy Status: No Living Arrangements: Parent Can pt return to current living arrangement?: Yes Admission Status: Voluntary Is patient capable of signing voluntary admission?: Yes Referral Source: Self/Family/Friend Insurance type: none     Crisis Care Plan Living Arrangements: Parent Name of Psychiatrist: none Name of Therapist: none  Education Status Is patient currently in school?: No Highest grade of school patient has completed: 12th  Risk to self with the past 6 months Suicidal Ideation: Yes-Currently Present Has patient been a risk to self within the past 6 months prior to admission? : Yes Suicidal Intent: Yes-Currently Present Has patient had any suicidal intent within the past 6 months prior to  admission? : Yes Is patient at risk for suicide?: Yes Suicidal Plan?: Yes-Currently Present Has patient had any suicidal plan within the past 6 months prior to admission? : Yes Specify Current Suicidal Plan: pt plans to O/D on Depakote Access to Means: Yes Specify Access to Suicidal Means: pt has access to medication  What has been your use of drugs/alcohol within the last 12 months?: pt  reports he uses marijuana  Previous Attempts/Gestures: Yes How many times?: 2 Other Self Harm Risks: h/o suicide and hospitalizations Triggers for Past Attempts: Spouse contact (pt reports conflict with his girlfriend) Intentional Self Injurious Behavior: Cutting Comment - Self Injurious Behavior: pt reports "7000 years ago." Family Suicide History: Unknown Recent stressful life event(s): Conflict (Comment) (issues with girlfriend) Persecutory voices/beliefs?: No Depression: Yes Depression Symptoms: Insomnia, Tearfulness, Guilt, Feeling worthless/self pity, Feeling angry/irritable Substance abuse history and/or treatment for substance abuse?: Yes Suicide prevention information given to non-admitted patients: Not applicable  Risk to Others within the past 6 months Homicidal Ideation: No Does patient have any lifetime risk of violence toward others beyond the six months prior to admission? : No Thoughts of Harm to Others: No Current Homicidal Intent: No Current Homicidal Plan: No Access to Homicidal Means: No History of harm to others?: No Assessment of Violence: None Noted Does patient have access to weapons?: No Criminal Charges Pending?: Yes Describe Pending Criminal Charges: pt reports he has a court date in [place] Does patient have a court date: Yes Court Date:  (pt unsure of exact date but states it is in October) Is patient on probation?: No  Psychosis Hallucinations: None noted Delusions: None noted  Mental Status Report Appearance/Hygiene: In scrubs Eye Contact: Unable to Assess (camera angle was facing the wall, unable to see pt.) Motor Activity: Unable to assess Speech: Logical/coherent Level of Consciousness: Alert Mood: Depressed, Despair, Empty, Irritable, Helpless Affect: Depressed, Irritable Anxiety Level: Minimal Thought Processes: Coherent Judgement: Impaired Orientation: Person, Place, Time Obsessive Compulsive Thoughts/Behaviors: None  Cognitive  Functioning Concentration: Normal Memory: Recent Intact, Remote Impaired IQ: Average Insight: Fair Impulse Control: Fair Appetite: Good Sleep: Decreased Total Hours of Sleep: 3 Vegetative Symptoms: None  ADLScreening [facility] Assessment Services) Patient's cognitive ability adequate to safely complete daily activities?: Yes Patient able to express need for assistance with ADLs?: Yes Independently performs ADLs?: Yes (appropriate for developmental age)  Prior Inpatient Therapy Prior Inpatient Therapy: Yes Prior Therapy Dates: Multiple, last [facility] inpatient 5.2016 Prior Therapy Facilty/Provider(s): [facility] Reason for Treatment: substance use, depression  Prior Outpatient Therapy Prior Outpatient Therapy: Yes Prior Therapy Dates: Ongoing Prior Therapy Facilty/Provider(s): Provider located in [place] Reason for Treatment: bipolar d/o Does patient have an ACCT team?: No Does patient have Intensive In-House Services?  : No Does patient have [facility] services? : No Does patient have P4CC services?: No  ADL Screening (condition at time of admission) Patient's cognitive ability adequate to safely complete daily activities?: Yes Is the patient deaf or have difficulty hearing?: No Does the patient have difficulty seeing, even when wearing glasses/contacts?: No Does the patient have difficulty concentrating, remembering, or making decisions?: No Patient able to express need for assistance with ADLs?: Yes Does the patient have difficulty dressing or bathing?: No Independently performs ADLs?: Yes (appropriate for developmental age) Does the patient have difficulty walking or climbing stairs?: No Weakness of Legs: None Weakness of Arms/Hands: None  Home Assistive Devices/Equipment Home Assistive Devices/Equipment: None    Abuse/Neglect Assessment (Assessment to be complete while patient is alone) Physical Abuse: Denies Verbal  Abuse: Denies Sexual Abuse: Denies Exploitation of  patient/patient's resources: Denies Self-Neglect: Denies     Merchant navy officerAdvance Directives (For Healthcare) Does patient have an advance directive?: No Would patient like information on creating an advanced directive?: No - patient declined information    Additional Information 1:1 In Past 12 Months?: No [name] Risk: No Elopement Risk: No Does patient have medical clearance?: Yes     Disposition:  Pt meets inpatient criteria per Donell SievertSpencer Simon, PA . [facility] at [facility]. Pt will be referred out.   Disposition Initial Assessment Completed for this Encounter: Yes  Karolee Ohsquicha R Duff 07/26/2016 10:47 PM

## 2016-07-26 NOTE — BH Assessment (Signed)
Assessor advised Marshia LyBrandon Church, RN and Benjiman CoreNathan Pickering, MD that pt meets inpt criteria and will be referred out due to [facility].   Princess BruinsAquicha Duff, MSW, Theresia MajorsLCSWA

## 2016-07-26 NOTE — ED Triage Notes (Signed)
Pt states he is suicidal. [facility] same but states "I talked my way out of it". Pt states he would like inpatient treatment. Pt denies auditory/visual hallucinations. Pt calm and cooperative in triage.

## 2016-07-27 DIAGNOSIS — F4321 Adjustment disorder with depressed mood: Secondary | ICD-10-CM | POA: Diagnosis not present

## 2016-07-27 NOTE — ED Notes (Signed)
Dr Lyndee LeoA and Jamison DNP into.

## 2016-07-27 NOTE — Consult Note (Signed)
[facility] Face-to-Face Psychiatry Consult   Reason for Consult:  Depression  Referring Physician:  EDP Patient Identification: [name] MRN:  [id number] Principal Diagnosis: Adjustment disorder with depressed mood Diagnosis:   Patient Active Problem List   Diagnosis Date Noted  . Ankle contusion [S90.00XA] 08/24/2014    Priority: High  . Adjustment disorder with depressed mood [F43.21] 08/24/2014    Priority: High  . Polysubstance dependence (HCC) [F19.20] 09/17/2013    Priority: High  . Substance induced mood disorder (HCC) [F19.94] 04/18/2015  . GAD (generalized anxiety disorder) [F41.1] 04/18/2015  . Major depressive disorder, recurrent episode, moderate (HCC) [F33.1]   . Major depressive disorder, recurrent episode, severe (HCC) [F33.2] 01/23/2015    Class: Acute  . Major depressive disorder, recurrent (HCC) [F33.9] 01/23/2015  . Polysubstance (excluding opioids) dependence without physiological dependence (HCC) [F19.20] 09/22/2013  . 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 Suicide threat or attempt [F48.9] 09/22/2013  . PTSD (post-traumatic stress disorder) [F43.10] 09/22/2013  . Suicidal ideation [R45.851] 09/22/2013    Total Time spent with patient: 45 minutes  Subjective:   [name] is a 25 y.o. male patient does not warrant admission.  HPI:  25 yo male who left [facility] after reporting he was not suicidal.  He came to [place] stating he lied.  Today, he denies suicidal/homicidal ideations, hallucinations,and withdrawal admission.  He wanted to sleep and not talk this morning.  His biggest issue is being homeless.   Past Psychiatric History: substance abuse, bipolar disorder  Risk to Self: Suicidal Ideation: Yes-Currently Present Suicidal Intent: Yes-Currently Present Is patient at risk for suicide?: Yes Suicidal Plan?: Yes-Currently Present Specify Current Suicidal Plan: pt plans to O/D on Depakote Access to  Means: Yes Specify Access to Suicidal Means: pt has access to medication  What has been your use of drugs/alcohol within the last 12 months?: pt reports he uses marijuana  How many times?: 2 Other Self Harm Risks: h/o suicide and hospitalizations Triggers for Past Attempts: Spouse contact (pt reports conflict with his girlfriend) Intentional Self Injurious Behavior: Cutting Comment - Self Injurious Behavior: pt reports "7000 years ago." Risk to Others: Homicidal Ideation: No Thoughts of Harm to Others: No Current Homicidal Intent: No Current Homicidal Plan: No Access to Homicidal Means: No History of harm to others?: No Assessment of Violence: None Noted Does patient have access to weapons?: No Criminal Charges Pending?: Yes Describe Pending Criminal Charges: pt reports he has a court date in [place] Does patient have a court date: Yes Court Date:  (pt unsure of exact date but states it is in October) Prior Inpatient Therapy: Prior Inpatient Therapy: Yes Prior Therapy Dates: Multiple, last [facility] inpatient 5.2016 Prior Therapy Facilty/Provider(s): [facility] Reason for Treatment: substance use, depression Prior Outpatient Therapy: Prior Outpatient Therapy: Yes Prior Therapy Dates: Ongoing Prior Therapy Facilty/Provider(s): Provider located in [place] Reason for Treatment: bipolar d/o Does patient have an ACCT team?: No Does patient have Intensive In-House Services?  : No Does patient have [facility] services? : No Does patient have P4CC services?: No  Past Medical History:  Past Medical History:  Diagnosis Date  . Anxiety   . Depression   . Mental disorder    History reviewed. No pertinent surgical history. Family History: No family history on file. Family Psychiatric  History: none Social History:  History  Alcohol Use  . 14.4 oz/week  . 24 Cans of beer per week    Comment: case daily  History  Drug Use  . Types: LSD, Marijuana    Comment: reports quitting alcohol and drugs  6 months ago    Social History   Social History  . Marital status: Single    Spouse name: N/A  . Number of children: N/A  . Years of education: N/A   Social History Main Topics  . Smoking status: Current Every Day Smoker    Packs/day: 0.50    Years: 11.00    Types: Cigarettes  . Smokeless tobacco: Never Used  . Alcohol use 14.4 oz/week    24 Cans of beer per week     Comment: case daily  . Drug use:     Types: LSD, Marijuana     Comment: reports quitting alcohol and drugs 6 months ago  . Sexual activity: Yes    Birth control/ protection: None   Other Topics Concern  . None   Social History Narrative  . None   Additional Social History:    Allergies:   Allergies  Allergen Reactions  . Trazodone And Nefazodone Other (See Comments)    Lethargic next morning, dizziness, confusion    Labs:  Results for orders placed or performed during the hospital encounter of 07/26/16 (from the past 48 hour(s))  Valproic acid level     Status: None   Collection Time: 07/26/16  8:02 PM  Result Value Ref Range   Valproic Acid Lvl 62 50.0 - 100.0 ug/mL    Current Facility-Administered Medications  Medication Dose Route Frequency Provider Last Rate Last Dose  . acetaminophen (TYLENOL) tablet 650 mg  650 mg Oral Q4H PRN Benjiman CoreNathan Pickering, MD      . divalproex (DEPAKOTE) DR tablet 500 mg  500 mg Oral BID Benjiman CoreNathan Pickering, MD   500 mg at 07/26/16 2200  . ibuprofen (ADVIL,MOTRIN) tablet 600 mg  600 mg Oral Q8H PRN Benjiman CoreNathan Pickering, MD      . lamoTRIgine (LAMICTAL) tablet 25 mg  25 mg Oral BID Benjiman CoreNathan Pickering, MD   25 mg at 07/26/16 2200   Current Outpatient Prescriptions  Medication Sig Dispense Refill  . acetaminophen (TYLENOL) 500 MG tablet Take 1,000 mg by mouth every 6 (six) hours as needed for mild pain.     Marland Kitchen. buPROPion (WELLBUTRIN XL) 150 MG 24 hr tablet Take 150 mg by mouth daily.    . clonazePAM (KLONOPIN) 1 MG tablet Take 1 mg by mouth 3 (three) times daily as needed for  anxiety.    . divalproex (DEPAKOTE) 500 MG DR tablet Take 500 mg by mouth 2 (two) times daily. Takes 1 tab in am and 2 tabs in pm    . lamoTRIgine (LAMICTAL) 25 MG tablet Take 25 mg by mouth 2 (two) times daily.      Musculoskeletal: Strength & Muscle Tone: within normal limits Gait & Station: normal Patient leans: N/A  Psychiatric Specialty Exam: Physical Exam  Constitutional: He is oriented to person, place, and time. He appears well-developed and well-nourished.  HENT:  Head: Normocephalic.  Neck: Normal range of motion.  Respiratory: Effort normal.  Musculoskeletal: Normal range of motion.  Neurological: He is alert and oriented to person, place, and time.  Skin: Skin is warm and dry.  Psychiatric: His speech is normal and behavior is normal. Judgment and thought content normal. Cognition and memory are normal. He exhibits a depressed mood.    Review of Systems  Constitutional: Negative.   HENT: Negative.   Eyes: Negative.   Respiratory: Negative.   Cardiovascular:  Negative.   Gastrointestinal: Negative.   Genitourinary: Negative.   Musculoskeletal: Negative.   Skin: Negative.   Neurological: Negative.   Endo/Heme/Allergies: Negative.   Psychiatric/Behavioral: Positive for depression and substance abuse.    Blood pressure 130/75, pulse 85, temperature 98.3 F (36.8 C), temperature source Oral, resp. rate 18, SpO2 100 %.There is no height or weight on file to calculate BMI.  General Appearance: Casual  Eye Contact:  Fair  Speech:  Normal Rate  Volume:  Normal  Mood:  Depressed, mild  Affect:  Congruent  Thought Process:  Coherent and Descriptions of Associations: Intact  Orientation:  Full (Time, Place, and Person)  Thought Content:  WDL  Suicidal Thoughts:  No  Homicidal Thoughts:  No  Memory:  Immediate;   Good Recent;   Good Remote;   Good  Judgement:  Fair  Insight:  Fair  Psychomotor Activity:  Normal  Concentration:  Concentration: Good and Attention  Span: Good  Recall:  Good  Fund of Knowledge:  Fair  Language:  Good  Akathisia:  No  Handed:  Right  AIMS (if indicated):     Assets:  Leisure Time Physical Health Resilience  ADL's:  Intact  Cognition:  WNL  Sleep:        Treatment Plan Summary: Daily contact with patient to assess and evaluate symptoms and progress in treatment, Medication management and Plan adjustment disorder with depressed mood:  -Crisis stabilization -Medication management:  Continue Depakote 500 mg BID for mood stabilization and Lamictal 25 mg BID for bipolar disorder -Individual counseling  Disposition: No evidence of imminent risk to self or others at present.    Nanine Means, NP 07/27/2016 10:02 AM  Patient seen face-to-face for psychiatric evaluation, chart reviewed and case discussed with the physician extender and developed treatment plan. Reviewed the information documented and agree with the treatment plan. Thedore Mins, MD

## 2016-07-27 NOTE — Discharge Instructions (Signed)
You are encouraged to follow up with [facility] for continuation of care, medication management, and help with your recovery.

## 2016-07-27 NOTE — ED Notes (Addendum)
Written dc instructions reviewed with patient.  Pt encouraged to take his medications as directed and follow up with his MD when he returns to [place].  Pt reports that he does not have a local provider and is returning home soon.  Pt encouraged too seek treatment for return of suicidal/ homicidal thought or urges or AVH. Pt verbalized understanding as reported that he would do so.  Bus pass given

## 2016-07-27 NOTE — ED Notes (Addendum)
Pt ambulatory w/o difficulty w/ [name] wo dc area, belongings returned after leaving the area.

## 2016-07-27 NOTE — ED Notes (Signed)
Pt refused VS  

## 2016-07-27 NOTE — ED Notes (Signed)
Up to the bathroom 

## 2016-07-27 NOTE — [facility] Suicide Risk Assessment (Signed)
Suicide Risk Assessment  Discharge Assessment   [facility] Discharge Suicide Risk Assessment   Principal Problem: Adjustment disorder with depressed mood Discharge Diagnoses:  Patient Active Problem List   Diagnosis Date Noted  . Ankle contusion [S90.00XA] 08/24/2014    Priority: High  . Adjustment disorder with depressed mood [F43.21] 08/24/2014    Priority: High  . Polysubstance dependence (HCC) [F19.20] 09/17/2013    Priority: High  . Substance induced mood disorder (HCC) [F19.94] 04/18/2015  . GAD (generalized anxiety disorder) [F41.1] 04/18/2015  . Major depressive disorder, recurrent episode, moderate (HCC) [F33.1]   . Major depressive disorder, recurrent episode, severe (HCC) [F33.2] 01/23/2015    Class: Acute  . Major depressive disorder, recurrent (HCC) [F33.9] 01/23/2015  . Polysubstance (excluding opioids) dependence without physiological dependence (HCC) [F19.20] 09/22/2013  . 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 Suicide threat or attempt [F48.9] 09/22/2013  . PTSD (post-traumatic stress disorder) [F43.10] 09/22/2013  . Suicidal ideation [R45.851] 09/22/2013    Total Time spent with patient: 45 minutes  Musculoskeletal: Strength & Muscle Tone: within normal limits Gait & Station: normal Patient leans: N/A  Psychiatric Specialty Exam: Physical Exam  Constitutional: He is oriented to person, place, and time. He appears well-developed and well-nourished.  HENT:  Head: Normocephalic.  Neck: Normal range of motion.  Respiratory: Effort normal.  Musculoskeletal: Normal range of motion.  Neurological: He is alert and oriented to person, place, and time.  Skin: Skin is warm and dry.  Psychiatric: His speech is normal and behavior is normal. Judgment and thought content normal. Cognition and memory are normal. He exhibits a depressed mood.    Review of Systems  Constitutional: Negative.   HENT: Negative.   Eyes:  Negative.   Respiratory: Negative.   Cardiovascular: Negative.   Gastrointestinal: Negative.   Genitourinary: Negative.   Musculoskeletal: Negative.   Skin: Negative.   Neurological: Negative.   Endo/Heme/Allergies: Negative.   Psychiatric/Behavioral: Positive for depression and substance abuse.    Blood pressure 130/75, pulse 85, temperature 98.3 F (36.8 C), temperature source Oral, resp. rate 18, SpO2 100 %.There is no height or weight on file to calculate BMI.  General Appearance: Casual  Eye Contact:  Fair  Speech:  Normal Rate  Volume:  Normal  Mood:  Depressed, mild  Affect:  Congruent  Thought Process:  Coherent and Descriptions of Associations: Intact  Orientation:  Full (Time, Place, and Person)  Thought Content:  WDL  Suicidal Thoughts:  No  Homicidal Thoughts:  No  Memory:  Immediate;   Good Recent;   Good Remote;   Good  Judgement:  Fair  Insight:  Fair  Psychomotor Activity:  Normal  Concentration:  Concentration: Good and Attention Span: Good  Recall:  Good  Fund of Knowledge:  Fair  Language:  Good  Akathisia:  No  Handed:  Right  AIMS (if indicated):     Assets:  Leisure Time Physical Health Resilience  ADL's:  Intact  Cognition:  WNL  Sleep:       Mental Status Per Nursing Assessment::   On Admission:   depression  Demographic Factors:  Male, Adolescent or young adult and Caucasian  Loss Factors: NA  Historical Factors: NA  Risk Reduction Factors:   Sense of responsibility to family  Continued Clinical Symptoms:  Depression, mild  Cognitive Features That Contribute To Risk:  None    Suicide Risk:  Minimal: No identifiable suicidal ideation.  Patients presenting with no risk factors but  with morbid ruminations; may be classified as minimal risk based on the severity of the depressive symptoms    Plan Of Care/Follow-up recommendations:  Activity:  as tolerated Diet:  heart healthy diet  Korben Carcione, NP 07/27/2016, 10:09  AM

## 2016-08-05 ENCOUNTER — Emergency Department (HOSPITAL_COMMUNITY)
Admission: EM | Admit: 2016-08-05 | Discharge: 2016-08-06 | Disposition: A | Discharge status: Home / Self Care | Payer: Self-pay | Attending: Emergency Medicine | Admitting: Emergency Medicine

## 2016-08-05 ENCOUNTER — Encounter (HOSPITAL_COMMUNITY): Payer: Self-pay | Admitting: *Deleted

## 2016-08-05 DIAGNOSIS — F17210 Nicotine dependence, cigarettes, uncomplicated: Secondary | ICD-10-CM | POA: Insufficient documentation

## 2016-08-05 DIAGNOSIS — Z79899 Other long term (current) drug therapy: Secondary | ICD-10-CM | POA: Insufficient documentation

## 2016-08-05 DIAGNOSIS — F329 Major depressive disorder, single episode, unspecified: Secondary | ICD-10-CM | POA: Insufficient documentation

## 2016-08-05 DIAGNOSIS — F32A Depression, unspecified: Secondary | ICD-10-CM

## 2016-08-05 LAB — CBC
HCT: 43.2 % (ref 39.0–52.0)
Hemoglobin: 14.7 g/dL (ref 13.0–17.0)
MCH: 31.4 pg (ref 26.0–34.0)
MCHC: 34 g/dL (ref 30.0–36.0)
MCV: 92.3 fL (ref 78.0–100.0)
Platelets: 255 10*3/uL (ref 150–400)
RBC: 4.68 MIL/uL (ref 4.22–5.81)
RDW: 13.4 % (ref 11.5–15.5)
WBC: 8 10*3/uL (ref 4.0–10.5)

## 2016-08-05 LAB — COMPREHENSIVE METABOLIC PANEL
ALT: 22 U/L (ref 17–63)
AST: 20 U/L (ref 15–41)
Albumin: 4.2 g/dL (ref 3.5–5.0)
Alkaline Phosphatase: 45 U/L (ref 38–126)
Anion gap: 6 (ref 5–15)
BUN: 12 mg/dL (ref 6–20)
CO2: 28 mmol/L (ref 22–32)
Calcium: 9 mg/dL (ref 8.9–10.3)
Chloride: 106 mmol/L (ref 101–111)
Creatinine, Ser: 1.09 mg/dL (ref 0.61–1.24)
Glucose, Bld: 84 mg/dL (ref 65–99)
POTASSIUM: 4 mmol/L (ref 3.5–5.1)
Sodium: 140 mmol/L (ref 135–145)
Total Bilirubin: 0.6 mg/dL (ref 0.3–1.2)
Total Protein: 7.2 g/dL (ref 6.5–8.1)

## 2016-08-05 LAB — ACETAMINOPHEN LEVEL: Acetaminophen (Tylenol), Serum: <10 ug/mL — ABNORMAL LOW (ref 10–30)

## 2016-08-05 LAB — RAPID URINE DRUG SCREEN, HOSP PERFORMED
Amphetamines: NOT DETECTED
BARBITURATES: NOT DETECTED
Benzodiazepines: NOT DETECTED
COCAINE: NOT DETECTED
Opiates: NOT DETECTED
TETRAHYDROCANNABINOL: POSITIVE — AB

## 2016-08-05 LAB — ETHANOL

## 2016-08-05 LAB — SALICYLATE LEVEL

## 2016-08-05 NOTE — ED Triage Notes (Signed)
Pt states that he is having suicidal thoughts with no plan; pt states that he has been clean from drugs for 8 yrs; pt states that he has been smoking marijuana and doing Meth over the last few weeks; pt states "I don't want to get like I was before"

## 2016-08-06 ENCOUNTER — Encounter (HOSPITAL_COMMUNITY): Payer: Self-pay | Admitting: Emergency Medicine

## 2016-08-06 MED ORDER — ALUM & MAG HYDROXIDE-SIMETH 200-200-20 MG/5ML PO SUSP: 30.0000 mL | ORAL | Status: DC | PRN

## 2016-08-06 MED ORDER — ZOLPIDEM TARTRATE 5 MG PO TABS: 5.0000 mg | ORAL_TABLET | Freq: Every evening | ORAL | Status: DC | PRN

## 2016-08-06 MED ORDER — ACETAMINOPHEN 325 MG PO TABS: 650.0000 mg | ORAL_TABLET | ORAL | Status: DC | PRN

## 2016-08-06 MED ORDER — LORAZEPAM 1 MG PO TABS: 1.0000 mg | ORAL_TABLET | Freq: Three times a day (TID) | ORAL | Status: DC | PRN

## 2016-08-06 MED ORDER — IBUPROFEN 200 MG PO TABS: 600.0000 mg | ORAL_TABLET | Freq: Three times a day (TID) | ORAL | Status: DC | PRN

## 2016-08-06 MED ORDER — ONDANSETRON HCL 4 MG PO TABS: 4.0000 mg | ORAL_TABLET | Freq: Three times a day (TID) | ORAL | Status: DC | PRN

## 2016-08-06 NOTE — BH Assessment (Signed)
Assessment completed. Consulted Alberteen SamFran Hobson, NP who recommended that pt be evaluated in the morning.

## 2016-08-06 NOTE — ED Notes (Signed)
Assumed care of patient. Patient in restraints x 4 limb, police and security present with patient. Patient waiting for discharge into police custody.

## 2016-08-06 NOTE — ED Notes (Signed)
No respiratory or acute distress noted alert and oriented x 3 call light in reach no pain voiced. 

## 2016-08-06 NOTE — ED Triage Notes (Signed)
At 1102 pt placed in 4 pt gurney restraints for safety of the the staff.

## 2016-08-06 NOTE — ED Provider Notes (Signed)
[facility]-EMERGENCY DEPT Provider Note   CSN: [id number] Arrival date & time: 08/05/16  1913  By signing my name below, I, Doreatha Martinva Mathews, attest that this documentation has been prepared under the direction and in the presence of Kairi Harshbarger, MD. Electronically Signed: Doreatha MartinEva Mathews, ED Scribe. 08/06/16. 12:18 AM.     History   Chief Complaint Chief Complaint  Patient presents with  . Suicidal    HPI [name] is a 25 y.o. male who presents to the Emergency Department complaining of worsened depression for 3 weeks. He also reports SI with no specific plan. Pt states he and his SO broke up 3 weeks ago and this exacerbated his baseline depression. Pt states he currently takes Depakote and has not missed any doses. Pt reports he has also consumed increased amounts of marijuana and alcohol. Per pt, he has been drinking 7-8 beers/day for the last 3 weeks and notes he did not consume ETOH at all for 3 years prior to this. He also notes he took Meth for the first time last week, which significantly worsened his depression. Pt reports h/o psychiatric hospitalization, with last admission, lasting 3-4 days, 1 month ago. He denies HI, visual or auditory hallucinations.   The history is provided by the patient. No language interpreter was used.  Mental Health Problem  Presenting symptoms: depression and suicidal thoughts   Presenting symptoms: no homicidal ideas   Degree of incapacity (severity):  Moderate Onset quality:  Gradual Duration:  3 weeks Timing:  Constant Progression:  Worsening Chronicity:  Recurrent Context: alcohol use, drug abuse, medication and stressful life event   Treatment compliance:  All of the time Relieved by:  Nothing Worsened by:  Drugs Ineffective treatments:  Antidepressants Risk factors: hx of mental illness and recent psychiatric admission     Past Medical History:  Diagnosis Date  . Anxiety   . Depression   . Mental disorder     Patient Active Problem  List   Diagnosis Date Noted  . Substance induced mood disorder (HCC) 04/18/2015  . GAD (generalized anxiety disorder) 04/18/2015  . Major depressive disorder, recurrent episode, moderate (HCC)   . Major depressive disorder, recurrent episode, severe (HCC) 01/23/2015    Class: Acute  . Major depressive disorder, recurrent (HCC) 01/23/2015  . Ankle contusion 08/24/2014  . Adjustment disorder with depressed mood 08/24/2014  . Polysubstance (excluding opioids) dependence without physiological dependence (HCC) 09/22/2013  . 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 Suicide threat or attempt 09/22/2013  . PTSD (post-traumatic stress disorder) 09/22/2013  . Suicidal ideation 09/22/2013  . Polysubstance dependence (HCC) 09/17/2013    History reviewed. No pertinent surgical history.     Home Medications    Prior to Admission medications   Medication Sig Start Date End Date Taking? Authorizing Provider  clonazePAM (KLONOPIN) 1 MG tablet Take 1 mg by mouth 3 (three) times daily as needed for anxiety.   Yes Historical Provider, MD  divalproex (DEPAKOTE) 500 MG DR tablet Take 1,000 mg by mouth every morning. Takes 1 tab in am and 2 tabs in pm    Yes Historical Provider, MD  lamoTRIgine (LAMICTAL) 25 MG tablet Take 25 mg by mouth 2 (two) times daily.   Yes Historical Provider, MD  zolpidem (AMBIEN) 10 MG tablet Take 10 mg by mouth at bedtime as needed for sleep.   Yes Historical Provider, MD    Family History No family history on file.  Social History Social History  Substance Use Topics  . Smoking status: Current Every Day Smoker    Packs/day: 0.50    Years: 11.00    Types: Cigarettes  . Smokeless tobacco: Never Used  . Alcohol use 14.4 oz/week    24 Cans of beer per week     Comment: case daily     Allergies   Trazodone and nefazodone   Review of Systems Review of Systems  Psychiatric/Behavioral: Positive for dysphoric mood and  suicidal ideas. Negative for homicidal ideas.  All other systems reviewed and are negative.   Physical Exam Updated Vital Signs BP 123/84 (BP Location: Left Arm)   Pulse 76   Temp 98.1 F (36.7 C) (Oral)   Resp 19   SpO2 99%   Physical Exam  Constitutional: He is oriented to person, place, and time. He appears well-developed and well-nourished.  HENT:  Head: Normocephalic and atraumatic.  Mouth/Throat: Oropharynx is clear and moist. No oropharyngeal exudate.  Moist mucous membranes. No exudate.   Eyes: Conjunctivae and EOM are normal. Pupils are equal, round, and reactive to light.  Neck: Normal range of motion. Neck supple. No JVD present. No tracheal deviation present.  No carotid bruits. Trachea midline.   Cardiovascular: Normal rate, regular rhythm and normal heart sounds.  Exam reveals no gallop and no friction rub.   No murmur heard. RRR. Intact DPs.   Pulmonary/Chest: Effort normal and breath sounds normal. No stridor. No respiratory distress. He has no wheezes. He has no rales.  Lungs CTA bilaterally.   Abdominal: Soft. Bowel sounds are normal. He exhibits no distension. There is no rebound and no guarding.  Musculoskeletal: Normal range of motion.  Lymphadenopathy:    He has no cervical adenopathy.  Neurological: He is alert and oriented to person, place, and time. He has normal reflexes.  Skin: Skin is warm and dry. Capillary refill takes less than 2 seconds.  No lesions.   Psychiatric: He has a normal mood and affect. His speech is delayed.  Flat affect  Nursing note and vitals reviewed.    ED Treatments / Results   Vitals:   08/05/16 1925 08/06/16 0116  BP: 123/84 (!) 115/53  Pulse: 76 (!) 55  Resp: 19 16  Temp: 98.1 F (36.7 C) 98.1 F (36.7 C)   Results for orders placed or performed during the hospital encounter of 08/05/16  Comprehensive metabolic panel  Result Value Ref Range   Sodium 140 135 - 145 mmol/L   Potassium 4.0 3.5 - 5.1 mmol/L    Chloride 106 101 - 111 mmol/L   CO2 28 22 - 32 mmol/L   Glucose, Bld 84 65 - 99 mg/dL   BUN 12 6 - 20 mg/dL   Creatinine, Ser 1.61 0.61 - 1.24 mg/dL   Calcium 9.0 8.9 - 09.6 mg/dL   Total Protein 7.2 6.5 - 8.1 g/dL   Albumin 4.2 3.5 - 5.0 g/dL   AST 20 15 - 41 U/L   ALT 22 17 - 63 U/L   Alkaline Phosphatase 45 38 - 126 U/L   Total Bilirubin 0.6 0.3 - 1.2 mg/dL   GFR calc non Af Amer >60 >60 mL/min   GFR calc Af Amer >60 >60 mL/min   Anion gap 6 5 - 15  Ethanol  Result Value Ref Range   Alcohol, Ethyl (B) <5 <5 mg/dL  Salicylate level  Result Value Ref Range   Salicylate Lvl <4.0 2.8 - 30.0 mg/dL  Acetaminophen level  Result Value Ref Range  Acetaminophen (Tylenol), Serum <10 (L) 10 - 30 ug/mL  cbc  Result Value Ref Range   WBC 8.0 4.0 - 10.5 K/uL   RBC 4.68 4.22 - 5.81 MIL/uL   Hemoglobin 14.7 13.0 - 17.0 g/dL   HCT 16.1 09.6 - 04.5 %   MCV 92.3 78.0 - 100.0 fL   MCH 31.4 26.0 - 34.0 pg   MCHC 34.0 30.0 - 36.0 g/dL   RDW 40.9 81.1 - 91.4 %   Platelets 255 150 - 400 K/uL  Rapid urine drug screen (hospital performed)  Result Value Ref Range   Opiates NONE DETECTED NONE DETECTED   Cocaine NONE DETECTED NONE DETECTED   Benzodiazepines NONE DETECTED NONE DETECTED   Amphetamines NONE DETECTED NONE DETECTED   Tetrahydrocannabinol POSITIVE (A) NONE DETECTED   Barbiturates NONE DETECTED NONE DETECTED   No results found.   DIAGNOSTIC STUDIES: Oxygen Saturation is 99% on RA, normal by my interpretation.    COORDINATION OF CARE: 12:14 AM Discussed treatment plan with pt at bedside which includes lab work and pt agreed to plan.    Labs (all labs ordered are listed, but only abnormal results are displayed) Labs Reviewed  ACETAMINOPHEN LEVEL - Abnormal; Notable for the following:       Result Value   Acetaminophen (Tylenol), Serum <10 (*)    All other components within normal limits  URINE RAPID DRUG SCREEN, HOSP PERFORMED - Abnormal; Notable for the following:     Tetrahydrocannabinol POSITIVE (*)    All other components within normal limits  COMPREHENSIVE METABOLIC PANEL  ETHANOL  SALICYLATE LEVEL  CBC    EKG  EKG Interpretation None       Radiology No results found.  Procedures Procedures (including critical care time)  Medications Ordered in ED Medications - No data to display   Initial Impression / Assessment and Plan / ED Course  I have reviewed the triage vital signs and the nursing notes.  Pertinent labs & imaging results that were available during my care of the patient were reviewed by me and considered in my medical decision making (see chart for details).  Clinical Course    ED psych hold placed seen by TTS  Final Clinical Impressions(s) / ED Diagnoses   Final diagnoses:  None    New Prescriptions New Prescriptions   No medications on file  PER TTS will need to be held to see the psychiatrist.    I personally performed the services described in this documentation, which was scribed in my presence. The recorded information has been reviewed and is accurate.      Cy Blamer, MD 08/06/16 [phone]

## 2016-08-06 NOTE — Discharge Instructions (Signed)
You have been seen today in the emergency Department for depression. As we discussed, you have been given information to follow up with outpatient resources to help you with your substance abuse issues.  Please follow up with the outpatient providers as soon as possible.  Return to the emergency department with new or worsening symptoms that concern you or if he develop any thoughts of hurting yourself or anyone else.

## 2016-08-06 NOTE — BH Assessment (Signed)
Tele Assessment Note   [name] is an 25 y.o. male presenting to [facility] reporting increasing depression and suicidal ideation. Pt stated "I am depressed, I broke up with my girlfriend, I have been doing a lot of drugs, I feel suicidal and I don't really feel safe". Pt is endorsing suicidal ideation at this time but denies having a plan. Pt reported that he has attempted suicide multiple times in the past by overdose on advil, hanging self, throwing self onto a moving car and overdose on prescription medication. Pt has had several psychiatric admissions in the past. Pt is currently not receiving any mental health treatment at this time but shared that he saw a psychiatrist and therapist while living in [place]. PT reported that he is dealing with multiple life stressors and shared that his recent break up is contributing to his depression. Pt is reporting multiple depressive symptoms. PT reported that his appetite has increase due to being prescribed Depakote. Pt denies HI an AVH at this time. Pt reported that he has a pending charge in [place] with an upcoming court date in October. Pt reported that he has been smoking marijuana and tried methamphetamine for the first time last week. Pt reported that he has been drinking 7-8 beers nightly for the past 3 days. Pt denies physical, sexual and emotional abuse. No traumatic experiences reported.   Diagnosis: Major Depressive Disorder, Recurrent episode, Severe, without psychosis   Past Medical History:  Past Medical History:  Diagnosis Date  . Anxiety   . Depression   . Mental disorder     History reviewed. No pertinent surgical history.  Family History: No family history on file.  Social History:  reports that he has been smoking Cigarettes.  He has a 5.50 pack-year smoking history. He has never used smokeless tobacco. He reports that he drinks about 14.4 oz of alcohol per week . He reports that he uses drugs, including LSD and  Marijuana.  Additional Social History:  Alcohol / Drug Use History of alcohol / drug use?: Yes Longest period of sobriety (when/how long): 8 years  Substance #1 Name of Substance 1: Marijuana  1 - Age of First Use: 10 1 - Amount (size/oz): up to 4 grams  1 - Frequency: daily  1 - Duration: ongoing  1 - Last Use / Amount: 08-05-16 Substance #2 Name of Substance 2: Alcohol  2 - Age of First Use: 13 2 - Amount (size/oz): 12 pack of beer 2 - Frequency: daily  2 - Duration: ongoing  2 - Last Use / Amount: 08-04-16 Substance #3 Name of Substance 3: Methamphetamine  3 - Age of First Use: 24 3 - Amount (size/oz): unknown  3 - Frequency: once  3 - Duration: once  3 - Last Use / Amount: "last week"   CIWA: CIWA-Ar BP: (!) 115/53 Pulse Rate: (!) 55 COWS:    PATIENT STRENGTHS: (choose at least two) Average or above average intelligence Motivation for treatment/growth  Allergies:  Allergies  Allergen Reactions  . Trazodone And Nefazodone Other (See Comments)    Lethargic next morning, dizziness, confusion    Home Medications:  (Not in a hospital admission)  OB/GYN Status:  No LMP for male patient.  General Assessment Data Location of Assessment: [facility] TTS Assessment: In system Is this a Tele or Face-to-Face Assessment?: Face-to-Face Is this an Initial Assessment or a Re-assessment for this encounter?: Initial Assessment Marital status: Divorced Living Arrangements: Parent Can pt return to current living arrangement?:  Yes Admission Status: Voluntary Is patient capable of signing voluntary admission?: Yes Referral Source: Self/Family/Friend Insurance type: None      Crisis Care Plan Living Arrangements: Parent Name of Psychiatrist: No provider reported.  Name of Therapist: No provider reported.   Education Status Is patient currently in school?: No Highest grade of school patient has completed: 12th  Risk to self with the past 6 months Suicidal Ideation:  Yes-Currently Present Has patient been a risk to self within the past 6 months prior to admission? : Yes Suicidal Intent: No Has patient had any suicidal intent within the past 6 months prior to admission? : Yes Is patient at risk for suicide?: Yes Suicidal Plan?: No Has patient had any suicidal plan within the past 6 months prior to admission? : Yes Specify Current Suicidal Plan: Pt denies having a plan tonight.  Access to Means: No Specify Access to Suicidal Means: Pt denies having a plan.  What has been your use of drugs/alcohol within the last 12 months?: Alcohol, meth and THC use reported.  Previous Attempts/Gestures: Yes How many times?: 4 Other Self Harm Risks: Burning  Triggers for Past Attempts: Spouse contact (pt reports conflict with his girlfriend) Intentional Self Injurious Behavior: Burning, Cutting Comment - Self Injurious Behavior: pt reported that he has burned self in the past. Cutting from previous assessment.  Family Suicide History: No Recent stressful life event(s): Other (Comment) (broke up with girlfriend. ) Persecutory voices/beliefs?: No Depression: Yes Depression Symptoms: Tearfulness, Isolating, Fatigue, Guilt, Loss of interest in usual pleasures, Feeling worthless/self pity, Feeling angry/irritable Substance abuse history and/or treatment for substance abuse?: Yes Suicide prevention information given to non-admitted patients: Not applicable  Risk to Others within the past 6 months Homicidal Ideation: No Does patient have any lifetime risk of violence toward others beyond the six months prior to admission? : No Thoughts of Harm to Others: No Current Homicidal Intent: No Current Homicidal Plan: No Access to Homicidal Means: No Identified Victim: N/A History of harm to others?: No Assessment of Violence: None Noted Violent Behavior Description: No violent behaviors observed.  Does patient have access to weapons?: No Criminal Charges Pending?:  Yes Describe Pending Criminal Charges: Vandalism  Does patient have a court date: Yes Court Date:  (October 2017) Is patient on probation?: No  Psychosis Hallucinations: None noted Delusions: None noted  Mental Status Report Appearance/Hygiene: In scrubs Eye Contact: Good Motor Activity: Freedom of movement Speech: Logical/coherent Level of Consciousness: Alert Mood: Depressed Affect: Depressed Anxiety Level: Minimal Thought Processes: Coherent, Relevant Judgement: Impaired Orientation: Person, Place, Time Obsessive Compulsive Thoughts/Behaviors: None  Cognitive Functioning Concentration: Fair Memory: Recent Intact, Remote Intact IQ: Average Insight: Fair Impulse Control: Fair Appetite: Good Weight Loss: 0 Weight Gain: 16 (on Depakote) Sleep: Increased Total Hours of Sleep: 4 Vegetative Symptoms: Staying in bed  ADLScreening [facility]) Patient's cognitive ability adequate to safely complete daily activities?: Yes Patient able to express need for assistance with ADLs?: Yes Independently performs ADLs?: Yes (appropriate for developmental age)  Prior Inpatient Therapy Prior Inpatient Therapy: Yes Prior Therapy Dates: 2009, 2010, 2012, 2014, 2016, 2017 [facility]  Reason for Treatment: substance use, depression  Prior Outpatient Therapy Prior Outpatient Therapy: Yes Prior Therapy Dates: 2017 Prior Therapy Facilty/Provider(s): [facility]  Reason for Treatment: Bipolar  Does patient have an ACCT team?: No Does patient have Intensive In-House Services?  : No Does patient have [facility] services? : No Does patient have P4CC services?: No  ADL Screening (condition at time of admission) Patient's cognitive ability adequate to safely complete daily activities?: Yes Is the patient deaf or have difficulty hearing?: No Does the patient have difficulty seeing, even when wearing glasses/contacts?:  No Does the patient have difficulty concentrating, remembering, or making decisions?: No Patient able to express need for assistance with ADLs?: Yes Does the patient have difficulty dressing or bathing?: No Independently performs ADLs?: Yes (appropriate for developmental age) Does the patient have difficulty walking or climbing stairs?: No       Abuse/Neglect Assessment (Assessment to be complete while patient is alone) Physical Abuse: Denies Verbal Abuse: Denies Sexual Abuse: Denies Exploitation of patient/patient's resources: Denies Self-Neglect: Denies     Merchant navy officer (For Healthcare) Does patient have an advance directive?: No Would patient like information on creating an advanced directive?: No - patient declined information    Additional Information 1:1 In Past 12 Months?: No [name] Risk: No Elopement Risk: No Does patient have medical clearance?: Yes     Disposition:  Disposition Initial Assessment Completed for this Encounter: Yes Disposition of Patient: Other dispositions Other disposition(s): Other (Comment) (AM Psych eval)  Lura Falor S 08/06/2016 1:24 AM

## 2016-08-06 NOTE — ED Notes (Signed)
Pt has in belonging bag: black baseball cap, white shirt, black socks, black shoes, blue jeans, brown belt, black phone, value customer [phone], white charger, white lighter, big black book bag.
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
## 2017-02-27 ENCOUNTER — Emergency Department (HOSPITAL_COMMUNITY)
Admission: EM | Admit: 2017-02-27 | Discharge: 2017-02-28 | Disposition: A | Discharge status: Home / Self Care | Payer: Federal, State, Local not specified - Other | Attending: Emergency Medicine | Admitting: Emergency Medicine

## 2017-02-27 ENCOUNTER — Encounter (HOSPITAL_COMMUNITY): Payer: Self-pay

## 2017-02-27 DIAGNOSIS — F331 Major depressive disorder, recurrent, moderate: Secondary | ICD-10-CM

## 2017-02-27 DIAGNOSIS — F329 Major depressive disorder, single episode, unspecified: Secondary | ICD-10-CM | POA: Insufficient documentation

## 2017-02-27 DIAGNOSIS — R45851 Suicidal ideations: Secondary | ICD-10-CM

## 2017-02-27 DIAGNOSIS — F17210 Nicotine dependence, cigarettes, uncomplicated: Secondary | ICD-10-CM | POA: Insufficient documentation

## 2017-02-27 LAB — COMPREHENSIVE METABOLIC PANEL
ALK PHOS: 50 U/L (ref 38–126)
ALT: 11 U/L — AB (ref 17–63)
AST: 17 U/L (ref 15–41)
Albumin: 4.3 g/dL (ref 3.5–5.0)
Anion gap: 10 (ref 5–15)
BUN: 9 mg/dL (ref 6–20)
CALCIUM: 8.7 mg/dL — AB (ref 8.9–10.3)
CO2: 24 mmol/L (ref 22–32)
CREATININE: 0.98 mg/dL (ref 0.61–1.24)
Chloride: 106 mmol/L (ref 101–111)
GFR calc non Af Amer: >60 mL/min (ref >60)
Glucose, Bld: 114 mg/dL — ABNORMAL HIGH (ref 65–99)
Potassium: 3.7 mmol/L (ref 3.5–5.1)
SODIUM: 140 mmol/L (ref 135–145)
Total Bilirubin: 0.6 mg/dL (ref 0.3–1.2)
Total Protein: 6.3 g/dL — ABNORMAL LOW (ref 6.5–8.1)

## 2017-02-27 LAB — RAPID URINE DRUG SCREEN, HOSP PERFORMED
Amphetamines: NOT DETECTED
BARBITURATES: NOT DETECTED
Benzodiazepines: NOT DETECTED
Cocaine: NOT DETECTED
Opiates: NOT DETECTED
TETRAHYDROCANNABINOL: NOT DETECTED

## 2017-02-27 LAB — CBC
HCT: 41.9 % (ref 39.0–52.0)
HEMOGLOBIN: 14.5 g/dL (ref 13.0–17.0)
MCH: 30.2 pg (ref 26.0–34.0)
MCHC: 34.6 g/dL (ref 30.0–36.0)
MCV: 87.3 fL (ref 78.0–100.0)
Platelets: 303 10*3/uL (ref 150–400)
RBC: 4.8 MIL/uL (ref 4.22–5.81)
RDW: 12.5 % (ref 11.5–15.5)
WBC: 7.8 10*3/uL (ref 4.0–10.5)

## 2017-02-27 LAB — ETHANOL: Alcohol, Ethyl (B): 18 mg/dL — ABNORMAL HIGH (ref <5)

## 2017-02-27 LAB — SALICYLATE LEVEL

## 2017-02-27 LAB — ACETAMINOPHEN LEVEL: Acetaminophen (Tylenol), Serum: <10 ug/mL — ABNORMAL LOW (ref 10–30)

## 2017-02-27 MED ORDER — ZOLPIDEM TARTRATE 5 MG PO TABS: 10.0000 mg | ORAL_TABLET | Freq: Every evening | ORAL | Status: DC | PRN

## 2017-02-27 MED ORDER — CLONAZEPAM 0.5 MG PO TABS: 1.0000 mg | ORAL_TABLET | Freq: Three times a day (TID) | ORAL | Status: DC | PRN

## 2017-02-27 MED ORDER — DIVALPROEX SODIUM 250 MG PO DR TAB
1000.0000 mg | DELAYED_RELEASE_TABLET | Freq: Every morning | ORAL | Status: DC
  Filled 2017-02-27: qty 4

## 2017-02-27 MED ORDER — ACETAMINOPHEN 325 MG PO TABS: 650.0000 mg | ORAL_TABLET | Freq: Four times a day (QID) | ORAL | Status: DC | PRN

## 2017-02-27 MED ORDER — LAMOTRIGINE 25 MG PO TABS
25.0000 mg | ORAL_TABLET | Freq: Two times a day (BID) | ORAL | Status: DC
  Filled 2017-02-27: qty 1

## 2017-02-27 NOTE — ED Triage Notes (Signed)
Per Pt, pt is coming from home reporting plans to harm himself. Pt has a plan to take his Dad's pills. Pt has healed cut marks noted on both of his arms and reports that has had a Hx of depression and anxiety.

## 2017-02-27 NOTE — Progress Notes (Signed)
Referral submitted to: [place], [place], Christell Constant, [place], [address], [place] K. Sherlon Handing, LPC-A, [facility]  Counselor 02/27/2017 11:19 PM
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
## 2017-02-27 NOTE — ED Notes (Signed)
Patient wanded 

## 2017-02-28 DIAGNOSIS — F1990 Other psychoactive substance use, unspecified, uncomplicated: Secondary | ICD-10-CM

## 2017-02-28 DIAGNOSIS — F1290 Cannabis use, unspecified, uncomplicated: Secondary | ICD-10-CM

## 2017-02-28 DIAGNOSIS — R45851 Suicidal ideations: Secondary | ICD-10-CM

## 2017-02-28 DIAGNOSIS — F17210 Nicotine dependence, cigarettes, uncomplicated: Secondary | ICD-10-CM | POA: Diagnosis not present

## 2017-02-28 DIAGNOSIS — Z79899 Other long term (current) drug therapy: Secondary | ICD-10-CM

## 2017-02-28 NOTE — Progress Notes (Signed)
Per Jacki Cones, NP pt is able to be d/c [facility] RN made aware that the pt can be d/c.   Princess Bruins, LCSWA CSW Disposition [phone]
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
## 2017-02-28 NOTE — ED Provider Notes (Signed)
Patient has recanted and no longer is suicidal. He plans on going to [facility] for counseling. TTS is comfortable with D/C.   Mancel Bale, MD 02/28/17 1040

## 2017-02-28 NOTE — ED Notes (Signed)
Pt being reassessed by TTS.

## 2017-02-28 NOTE — ED Notes (Signed)
Pt given all belongings back, given bus pass and understands discharge instructions.

## 2017-02-28 NOTE — [facility] Counselor (Signed)
Pt denies SI/HI and AVH.  Pt will be re-assessed by Jacki Cones, NP for D/C.  Wolfgang Phoenix, [facility] Triage Specialist

## 2017-02-28 NOTE — ED Notes (Signed)
TTS being done at this time.  

## 2017-02-28 NOTE — Discharge Instructions (Signed)
Follow up at [facility] for treatment as planned.

## 2017-10-15 ENCOUNTER — Emergency Department (HOSPITAL_COMMUNITY): Payer: Self-pay

## 2017-10-15 ENCOUNTER — Emergency Department (HOSPITAL_COMMUNITY)
Admission: EM | Admit: 2017-10-15 | Discharge: 2017-10-15 | Disposition: A | Discharge status: Home / Self Care | Payer: Self-pay | Attending: Emergency Medicine | Admitting: Emergency Medicine

## 2017-10-15 ENCOUNTER — Other Ambulatory Visit: Payer: Self-pay

## 2017-10-15 ENCOUNTER — Encounter (HOSPITAL_COMMUNITY): Payer: Self-pay | Admitting: *Deleted

## 2017-10-15 DIAGNOSIS — N50812 Left testicular pain: Secondary | ICD-10-CM | POA: Insufficient documentation

## 2017-10-15 DIAGNOSIS — A749 Chlamydial infection, unspecified: Secondary | ICD-10-CM | POA: Insufficient documentation

## 2017-10-15 DIAGNOSIS — K5900 Constipation, unspecified: Secondary | ICD-10-CM | POA: Insufficient documentation

## 2017-10-15 DIAGNOSIS — Z87891 Personal history of nicotine dependence: Secondary | ICD-10-CM | POA: Insufficient documentation

## 2017-10-15 DIAGNOSIS — R109 Unspecified abdominal pain: Secondary | ICD-10-CM | POA: Insufficient documentation

## 2017-10-15 HISTORY — DX: Constipation, unspecified: K59.00

## 2017-10-15 LAB — COMPREHENSIVE METABOLIC PANEL
ALK PHOS: 41 U/L (ref 38–126)
ALT: 13 U/L — AB (ref 17–63)
AST: 18 U/L (ref 15–41)
Albumin: 3.9 g/dL (ref 3.5–5.0)
Anion gap: 5 (ref 5–15)
BUN: 7 mg/dL (ref 6–20)
CALCIUM: 8.8 mg/dL — AB (ref 8.9–10.3)
CO2: 26 mmol/L (ref 22–32)
CREATININE: 0.94 mg/dL (ref 0.61–1.24)
Chloride: 107 mmol/L (ref 101–111)
Glucose, Bld: 92 mg/dL (ref 65–99)
Potassium: 4 mmol/L (ref 3.5–5.1)
SODIUM: 138 mmol/L (ref 135–145)
Total Bilirubin: 0.7 mg/dL (ref 0.3–1.2)
Total Protein: 5.9 g/dL — ABNORMAL LOW (ref 6.5–8.1)

## 2017-10-15 LAB — URINALYSIS, ROUTINE W REFLEX MICROSCOPIC
Bilirubin Urine: NEGATIVE
GLUCOSE, UA: NEGATIVE mg/dL
Hgb urine dipstick: NEGATIVE
KETONES UR: NEGATIVE mg/dL
Leukocytes, UA: NEGATIVE
Nitrite: NEGATIVE
PROTEIN: NEGATIVE mg/dL
Specific Gravity, Urine: 1.009 (ref 1.005–1.030)
pH: 7 (ref 5.0–8.0)

## 2017-10-15 LAB — CBC
HCT: 42.9 % (ref 39.0–52.0)
Hemoglobin: 14.9 g/dL (ref 13.0–17.0)
MCH: 30 pg (ref 26.0–34.0)
MCHC: 34.7 g/dL (ref 30.0–36.0)
MCV: 86.3 fL (ref 78.0–100.0)
PLATELETS: 276 10*3/uL (ref 150–400)
RBC: 4.97 MIL/uL (ref 4.22–5.81)
RDW: 12.6 % (ref 11.5–15.5)
WBC: 4.9 10*3/uL (ref 4.0–10.5)

## 2017-10-15 LAB — LIPASE, BLOOD: Lipase: 23 U/L (ref 11–51)

## 2017-10-15 MED ORDER — DICYCLOMINE HCL 20 MG PO TABS: 20.0000 mg | ORAL_TABLET | Freq: Two times a day (BID) | ORAL | 0 refills | Status: DC | PRN

## 2017-10-15 MED ORDER — DOCUSATE SODIUM 100 MG PO CAPS: 100.0000 mg | ORAL_CAPSULE | Freq: Two times a day (BID) | ORAL | 0 refills | Status: DC

## 2017-10-15 MED ORDER — POLYETHYLENE GLYCOL 3350 17 G PO PACK: PACK | ORAL | 0 refills | Status: AC

## 2017-10-15 MED ORDER — AZITHROMYCIN 1 G PO PACK
1.0000 g | PACK | Freq: Once | ORAL | Status: AC
  Administered 2017-10-15: 1 g via ORAL
  Filled 2017-10-15: qty 1

## 2017-10-15 MED ORDER — DOXYCYCLINE HYCLATE 100 MG PO CAPS: 100.0000 mg | ORAL_CAPSULE | Freq: Two times a day (BID) | ORAL | 0 refills | Status: DC

## 2017-10-15 NOTE — ED Provider Notes (Signed)
[facility] EMERGENCY DEPARTMENT Provider Note   CSN: [id number] Arrival date & time: 10/15/17  4098     History   Chief Complaint Chief Complaint  Patient presents with  . Abdominal Pain    HPI [name] is a 26 y.o. male.  HPI Patient with several months of intermittent constipation, abdominal pain and bloating.  States he uses over-the-counter medication with some relief.  No fever or chills.  States he intermittently has mucus in his stool but denies any blood in the stool.  No recent dietary changes.  Patient states he was tested for chlamydia last month in [place] and received the news that it was positive.  He is asking for treatment for chlamydia.  States he does have intermittent left testicle pain. Past Medical History:  Diagnosis Date  . Anxiety   . Constipation   . Depression   . Mental disorder     Patient Active Problem List   Diagnosis Date Noted  . Substance induced mood disorder (HCC) 04/18/2015  . GAD (generalized anxiety disorder) 04/18/2015  . Major depressive disorder, recurrent episode, moderate (HCC)   . Major depressive disorder, recurrent episode, severe (HCC) 01/23/2015    Class: Acute  . Major depressive disorder, recurrent (HCC) 01/23/2015  . Ankle contusion 08/24/2014  . Adjustment disorder with depressed mood 08/24/2014  . Polysubstance (excluding opioids) dependence without physiological dependence (HCC) 09/22/2013  . 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 Suicide threat or attempt 09/22/2013  . PTSD (post-traumatic stress disorder) 09/22/2013  . Suicidal ideation 09/22/2013  . Polysubstance dependence (HCC) 09/17/2013    History reviewed. No pertinent surgical history.     Home Medications    Prior to Admission medications   Medication Sig Start Date End Date Taking? Authorizing Provider  dicyclomine (BENTYL) 20 MG tablet Take 1 tablet (20 mg total) by mouth 2  (two) times daily as needed for spasms. 10/15/17   Loren Racer, MD  docusate sodium (COLACE) 100 MG capsule Take 1 capsule (100 mg total) by mouth every 12 (twelve) hours. 10/15/17   Loren Racer, MD  polyethylene glycol (MIRALAX / Ethelene Hal) packet 17 g mixed in 12 ounces of water.  Take hourly until you have a bowel movement. 10/15/17   Loren Racer, MD    Family History No family history on file.  Social History Social History   Tobacco Use  . Smoking status: Former Smoker    Packs/day: 0.50    Years: 11.00    Pack years: 5.50    Types: Cigarettes  . Smokeless tobacco: Never Used  Substance Use Topics  . Alcohol use: Yes    Alcohol/week: 14.4 oz    Types: 24 Cans of beer per week    Comment: case daily  . Drug use: Yes    Types: LSD, Marijuana    Comment: reports quitting alcohol and drugs 6 months ago     Allergies   Trazodone and nefazodone   Review of Systems Review of Systems  Constitutional: Negative for chills and fever.  Respiratory: Negative for shortness of breath.   Cardiovascular: Negative for chest pain, palpitations and leg swelling.  Gastrointestinal: Positive for abdominal distention, abdominal pain and constipation. Negative for blood in stool, diarrhea, nausea and vomiting.  Genitourinary: Positive for testicular pain. Negative for discharge, dysuria, flank pain, frequency, penile pain and scrotal swelling.  Musculoskeletal: Negative for back pain, myalgias, neck pain and neck stiffness.  Skin: Negative for rash and wound.  Neurological: Negative for dizziness, weakness, light-headedness, numbness and headaches.  All other systems reviewed and are negative.    Physical Exam Updated Vital Signs BP 115/66 (BP Location: Right Arm)   Pulse 62   Temp 98.1 F (36.7 C) (Oral)   Resp 16   SpO2 98%   Physical Exam  Constitutional: He is oriented to person, place, and time. He appears well-developed and well-nourished.  Non-toxic  appearance. He does not appear ill.  HENT:  Head: Normocephalic and atraumatic.  Mouth/Throat: Oropharynx is clear and moist. No oropharyngeal exudate.  Eyes: EOM are normal. Pupils are equal, round, and reactive to light.  Neck: Normal range of motion. Neck supple.  Cardiovascular: Normal rate and regular rhythm.  Pulmonary/Chest: Effort normal and breath sounds normal.  Abdominal: Soft. Bowel sounds are normal. There is no hepatosplenomegaly. There is no tenderness. There is no rebound and no guarding. Hernia confirmed negative in the right inguinal area and confirmed negative in the left inguinal area.  Genitourinary:  Genitourinary Comments: Patient refused genital exam  Musculoskeletal: Normal range of motion. He exhibits no edema or tenderness.  Neurological: He is alert and oriented to person, place, and time.  Skin: Skin is warm and dry. No rash noted. No erythema.  Psychiatric: He has a normal mood and affect. His behavior is normal.  Nursing note and vitals reviewed.    ED Treatments / Results  Labs (all labs ordered are listed, but only abnormal results are displayed) Labs Reviewed  COMPREHENSIVE METABOLIC PANEL - Abnormal; Notable for the following components:      Result Value   Calcium 8.8 (*)    Total Protein 5.9 (*)    ALT 13 (*)    All other components within normal limits  URINALYSIS, ROUTINE W REFLEX MICROSCOPIC - Abnormal; Notable for the following components:   Color, Urine STRAW (*)    All other components within normal limits  LIPASE, BLOOD  CBC    EKG  EKG Interpretation None       Radiology Dg Abd Acute W/chest  Result Date: 10/15/2017 CLINICAL DATA:  Constipation right-sided abdominal pain. EXAM: DG ABDOMEN ACUTE W/ 1V CHEST COMPARISON:  None. FINDINGS: There is no evidence of dilated bowel loops or free intraperitoneal air. Large amount of stool seen throughout the colon. No radiopaque calculi or other significant radiographic abnormality is  seen. Heart size and mediastinal contours are within normal limits. Both lungs are clear. IMPRESSION: Large stool burden. No evidence of bowel obstruction or ileus. No acute cardiopulmonary disease. Electronically Signed   By: Lupita RaiderJames  Green Jr, M.D.   On: 10/15/2017 14:08    Procedures Procedures (including critical care time)  Medications Ordered in ED Medications  azithromycin (ZITHROMAX) powder 1 g (not administered)     Initial Impression / Assessment and Plan / ED Course  I have reviewed the triage vital signs and the nursing notes.  Pertinent labs & imaging results that were available during my care of the patient were reviewed by me and considered in my medical decision making (see chart for details).     Benign abdominal exam.  Labs within normal limits. Constipation on x-ray.  Will start on Bentyl, MiraLAX and Colace and have follow-up with gastroneurology should symptoms persist.  Patient given a gram of azithromycin in the emergency department.  Advised to follow-up with health department have all sexual partners evaluated. Final Clinical Impressions(s) / ED Diagnoses   Final diagnoses:  Constipation, unspecified constipation type  Chlamydia  ED Discharge Orders        Ordered    docusate sodium (COLACE) 100 MG capsule  Every 12 hours     10/15/17 1427    polyethylene glycol (MIRALAX / GLYCOLAX) packet     10/15/17 1427    dicyclomine (BENTYL) 20 MG tablet  2 times daily PRN,   Status:  Discontinued     10/15/17 1427    doxycycline (VIBRAMYCIN) 100 MG capsule  2 times daily,   Status:  Discontinued     10/15/17 1427    dicyclomine (BENTYL) 20 MG tablet  2 times daily PRN     10/15/17 1435       Loren RacerYelverton, Apple Dearmas, MD 10/15/17 1437

## 2017-10-15 NOTE — ED Triage Notes (Addendum)
Pt states he has been having issues having a bowel movement of the last 3 months and reports it is constipation.  Pt states stool softners donot work.  He reports mucus with bowel movements and has pain to right abdominal pain and extends to testicle.  Pt states has dropped 10 pounds in the last month.  Reports nausea and fatigue. Pt was told in [place] in October and was told he had chlamydia and has not been treated

## 2017-10-15 NOTE — ED Notes (Signed)
Pt name called with no response x 1

## 2019-05-11 ENCOUNTER — Ambulatory Visit (HOSPITAL_COMMUNITY)
Admission: EM | Admit: 2019-05-11 | Discharge: 2019-05-11 | Disposition: A | Discharge status: Home / Self Care | Payer: Federal, State, Local not specified - Other | Attending: Psychiatry | Admitting: Psychiatry

## 2019-05-11 ENCOUNTER — Emergency Department (HOSPITAL_COMMUNITY)
Admission: EM | Admit: 2019-05-11 | Discharge: 2019-05-12 | Disposition: A | Discharge status: Other Acute Inpatient Hospital | Payer: Self-pay | Attending: Emergency Medicine | Admitting: Emergency Medicine

## 2019-05-11 ENCOUNTER — Other Ambulatory Visit: Payer: Self-pay

## 2019-05-11 ENCOUNTER — Encounter (HOSPITAL_COMMUNITY): Payer: Self-pay | Admitting: Medical

## 2019-05-11 DIAGNOSIS — Z0489 Encounter for examination and observation for other specified reasons: Secondary | ICD-10-CM | POA: Insufficient documentation

## 2019-05-11 DIAGNOSIS — F332 Major depressive disorder, recurrent severe without psychotic features: Secondary | ICD-10-CM | POA: Insufficient documentation

## 2019-05-11 DIAGNOSIS — Z87891 Personal history of nicotine dependence: Secondary | ICD-10-CM | POA: Insufficient documentation

## 2019-05-11 DIAGNOSIS — R45851 Suicidal ideations: Secondary | ICD-10-CM | POA: Insufficient documentation

## 2019-05-11 DIAGNOSIS — F329 Major depressive disorder, single episode, unspecified: Secondary | ICD-10-CM | POA: Insufficient documentation

## 2019-05-11 DIAGNOSIS — F419 Anxiety disorder, unspecified: Secondary | ICD-10-CM | POA: Insufficient documentation

## 2019-05-11 DIAGNOSIS — Z888 Allergy status to other drugs, medicaments and biological substances status: Secondary | ICD-10-CM | POA: Insufficient documentation

## 2019-05-11 DIAGNOSIS — R440 Auditory hallucinations: Secondary | ICD-10-CM

## 2019-05-11 DIAGNOSIS — F17210 Nicotine dependence, cigarettes, uncomplicated: Secondary | ICD-10-CM | POA: Insufficient documentation

## 2019-05-11 DIAGNOSIS — F333 Major depressive disorder, recurrent, severe with psychotic symptoms: Secondary | ICD-10-CM

## 2019-05-11 DIAGNOSIS — Z20828 Contact with and (suspected) exposure to other viral communicable diseases: Secondary | ICD-10-CM | POA: Insufficient documentation
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
## 2019-05-11 NOTE — ED Provider Notes (Signed)
[facility] DEPT Provider Note   CSN: [id number] Arrival date & time: 05/11/19  2154     History   Chief Complaint Chief Complaint  Patient presents with   Suicidal    HPI [name] is a 28 y.o. male.     Patient with a history of polysubstance abuse, depression, SI, PTSD presents with suicidal ideation. He was seen and assessed at [facility] prior to arrival and found to meet in patient criteria, with admission pending medical clearance. Arrives to [facility] ED by transport for medical clearance. No HI, AVH. No specific plan identified.   The history is provided by the patient and medical records. No language interpreter was used.    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/2019   Substance induced mood disorder ([place]) 04/18/2015   GAD (generalized anxiety disorder) 04/18/2015   Major depressive disorder, recurrent episode, moderate (HCC)    Major depressive disorder, recurrent episode, severe ([place]) 01/23/2015    Class: Acute   Major depressive disorder, recurrent ([place]) 01/23/2015   Ankle contusion 08/24/2014   Adjustment disorder with depressed mood 08/24/2014   Polysubstance (excluding opioids) dependence without physiological dependence ([place]) 09/22/2013   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      Home Medications     Prior to Admission medications   Medication Sig Start Date End Date Taking? Authorizing Provider  dicyclomine (BENTYL) 20 MG tablet Take 1 tablet (20 mg total) by mouth 2 (two) times daily as needed for spasms. 10/15/17   Julianne Rice, MD  docusate sodium (COLACE) 100 MG capsule Take 1 capsule (100 mg total) by mouth every 12 (twelve) hours. 10/15/17   Julianne Rice, MD  polyethylene glycol (MIRALAX / Floria Raveling) packet 17 g mixed in 12 ounces of water.  Take hourly until you have a bowel movement. 10/15/17   Julianne Rice, MD    Family History No family history on file.  Social History Social History   Tobacco Use   Smoking status: Former Smoker    Packs/day: 0.50    Years: 11.00    Pack years: 5.50    Types: Cigarettes   Smokeless tobacco: Never Used  Substance Use Topics   Alcohol use: Yes    Alcohol/week: 24.0 standard drinks    Types: 24 Cans of beer per week    Comment: case daily   Drug use: Yes    Types: LSD, Marijuana    Comment: reports quitting alcohol and drugs 6 months ago     Allergies   Trazodone and nefazodone   Review of Systems Review of Systems  Constitutional: Negative for chills and fever.  HENT: Negative.   Respiratory: Negative.   Cardiovascular: Negative.   Gastrointestinal: Negative.   Musculoskeletal: Negative.   Skin: Negative.   Neurological: Negative.   Psychiatric/Behavioral: Positive for dysphoric mood and suicidal ideas. Negative for hallucinations.     Physical Exam Updated Vital Signs  BP 126/88 (BP Location: Right Arm)    Pulse 85    Temp 98.1 F (36.7 C) (Oral)    Resp 15    Ht 5\' 9"  (1.753 m)    Wt 72.6 kg    SpO2 97%    BMI 23.63 kg/m   Physical Exam Constitutional:      Appearance: He is well-developed.  HENT:     Head: Normocephalic.  Neck:     Musculoskeletal: Normal range of motion and neck supple.  Cardiovascular:     Rate and Rhythm: Normal rate and regular rhythm.  Pulmonary:     Effort:  Pulmonary effort is normal.     Breath sounds: Normal breath sounds.  Abdominal:     General: Bowel sounds are normal.     Palpations: Abdomen is soft.     Tenderness: There is no abdominal tenderness. There is no guarding or rebound.  Musculoskeletal: Normal range of motion.  Skin:    General: Skin is warm and dry.     Findings: No rash.  Neurological:     Mental Status: He is alert and oriented to person, place, and time.  Psychiatric:        Attention and Perception: Attention normal.        Behavior: Behavior is cooperative.        Thought Content: Thought content includes suicidal ideation.      ED Treatments / Results  Labs (all labs ordered are listed, but only abnormal results are displayed) Labs Reviewed  COMPREHENSIVE METABOLIC PANEL  ETHANOL  SALICYLATE LEVEL  ACETAMINOPHEN LEVEL  CBC  RAPID URINE DRUG SCREEN, HOSP PERFORMED    EKG    Radiology No results found.  Procedures Procedures (including critical care time)  Medications Ordered in ED Medications - No data to display   Initial Impression / Assessment and Plan / ED Course  I have reviewed the triage vital signs and the nursing notes.  Pertinent labs & imaging results that were available during my care of the patient were reviewed by me and considered in my medical decision making (see chart for details).        Patient [facility] ED [facility], and found to meet inpatient criteria, needing medical clearance.   Patient with physical complaints in ED. No needs at this time. Labs pending.  12:35 - patient is considered medically cleared, COVID pending and will need to be resulted prior to [facility]. He has a ready bed and can come when test is resulted.   Final Clinical Impressions(s) / ED Diagnoses   Final diagnoses:  None   1. SI   ED Discharge Orders    None       Danne HarborUpstill, Christain Mcraney, PA-C 05/12/19 0037    Terrilee FilesButler, Michael C, MD 05/12/19 [phone]
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
## 2019-05-11 NOTE — ED Triage Notes (Signed)
Pt presents from [facility] for medical clearance after reporting suicidal ideation without plan.

## 2019-05-11 NOTE — ED Notes (Signed)
Bed: WTR5 Expected date:  Expected time:  Means of arrival:  Comments: 

## 2019-05-12 ENCOUNTER — Encounter (HOSPITAL_COMMUNITY): Payer: Self-pay

## 2019-05-12 ENCOUNTER — Inpatient Hospital Stay (HOSPITAL_COMMUNITY)
Admission: AD | Admit: 2019-05-12 | Discharge: 2019-05-13 | DRG: 885 | Disposition: A | Discharge status: Home / Self Care | Payer: Federal, State, Local not specified - Other | Source: Intra-hospital | Attending: Psychiatry | Admitting: Psychiatry

## 2019-05-12 ENCOUNTER — Other Ambulatory Visit: Payer: Self-pay

## 2019-05-12 DIAGNOSIS — Z915 Personal history of self-harm: Secondary | ICD-10-CM | POA: Diagnosis not present

## 2019-05-12 DIAGNOSIS — F410 Panic disorder [episodic paroxysmal anxiety] without agoraphobia: Secondary | ICD-10-CM | POA: Diagnosis present

## 2019-05-12 DIAGNOSIS — R45851 Suicidal ideations: Secondary | ICD-10-CM | POA: Diagnosis present

## 2019-05-12 DIAGNOSIS — F333 Major depressive disorder, recurrent, severe with psychotic symptoms: Principal | ICD-10-CM | POA: Diagnosis present

## 2019-05-12 DIAGNOSIS — F17290 Nicotine dependence, other tobacco product, uncomplicated: Secondary | ICD-10-CM | POA: Diagnosis present

## 2019-05-12 DIAGNOSIS — F250 Schizoaffective disorder, bipolar type: Secondary | ICD-10-CM | POA: Diagnosis not present

## 2019-05-12 LAB — COMPREHENSIVE METABOLIC PANEL
ALT: 18 U/L (ref 0–44)
AST: 19 U/L (ref 15–41)
Albumin: 4.6 g/dL (ref 3.5–5.0)
Alkaline Phosphatase: 54 U/L (ref 38–126)
Anion gap: 13 (ref 5–15)
BUN: 10 mg/dL (ref 6–20)
CO2: 21 mmol/L — ABNORMAL LOW (ref 22–32)
Calcium: 9.1 mg/dL (ref 8.9–10.3)
Chloride: 107 mmol/L (ref 98–111)
Creatinine, Ser: 0.87 mg/dL (ref 0.61–1.24)
GFR calc Af Amer: >60 mL/min (ref >60)
GFR calc non Af Amer: >60 mL/min (ref >60)
Glucose, Bld: 100 mg/dL — ABNORMAL HIGH (ref 70–99)
Potassium: 3.9 mmol/L (ref 3.5–5.1)
Sodium: 141 mmol/L (ref 135–145)
Total Bilirubin: 0.2 mg/dL — ABNORMAL LOW (ref 0.3–1.2)
Total Protein: 7.5 g/dL (ref 6.5–8.1)

## 2019-05-12 LAB — CBC
HCT: 47.2 % (ref 39.0–52.0)
Hemoglobin: 16 g/dL (ref 13.0–17.0)
MCH: 31.1 pg (ref 26.0–34.0)
MCHC: 33.9 g/dL (ref 30.0–36.0)
MCV: 91.8 fL (ref 80.0–100.0)
Platelets: 326 10*3/uL (ref 150–400)
RBC: 5.14 MIL/uL (ref 4.22–5.81)
RDW: 12.8 % (ref 11.5–15.5)
WBC: 8.2 10*3/uL (ref 4.0–10.5)
nRBC: 0 % (ref 0.0–0.2)

## 2019-05-12 LAB — RAPID URINE DRUG SCREEN, HOSP PERFORMED
Amphetamines: NOT DETECTED
Barbiturates: NOT DETECTED
Benzodiazepines: NOT DETECTED
Cocaine: NOT DETECTED
Opiates: NOT DETECTED
Tetrahydrocannabinol: NOT DETECTED

## 2019-05-12 LAB — SARS CORONAVIRUS 2 BY RT PCR (HOSPITAL ORDER, PERFORMED IN ~~LOC~~ HOSPITAL LAB): SARS Coronavirus 2: NEGATIVE

## 2019-05-12 LAB — SALICYLATE LEVEL: Salicylate Lvl: <7 mg/dL (ref 2.8–30.0)

## 2019-05-12 LAB — ACETAMINOPHEN LEVEL: Acetaminophen (Tylenol), Serum: <10 ug/mL — ABNORMAL LOW (ref 10–30)

## 2019-05-12 LAB — ETHANOL: Alcohol, Ethyl (B): 67 mg/dL — ABNORMAL HIGH (ref <10)

## 2019-05-12 MED ORDER — ILOPERIDONE 4 MG PO TABS
2.0000 mg | ORAL_TABLET | ORAL | Status: DC | PRN
  Filled 2019-05-12: qty 1

## 2019-05-12 MED ORDER — ACETAMINOPHEN 325 MG PO TABS: 650.0000 mg | ORAL_TABLET | Freq: Four times a day (QID) | ORAL | Status: DC | PRN

## 2019-05-12 MED ORDER — MAGNESIUM HYDROXIDE 400 MG/5ML PO SUSP: 30.0000 mL | Freq: Every day | ORAL | Status: DC | PRN

## 2019-05-12 MED ORDER — ALUM & MAG HYDROXIDE-SIMETH 200-200-20 MG/5ML PO SUSP: 30.0000 mL | ORAL | Status: DC | PRN

## 2019-05-12 MED ORDER — NICOTINE POLACRILEX 2 MG MT GUM
2.0000 mg | CHEWING_GUM | OROMUCOSAL | Status: DC | PRN
  Administered 2019-05-12: 2 mg via ORAL

## 2019-05-12 MED ORDER — TEMAZEPAM 15 MG PO CAPS
15.0000 mg | ORAL_CAPSULE | Freq: Every evening | ORAL | Status: DC | PRN
  Administered 2019-05-12: 15 mg via ORAL
  Filled 2019-05-12: qty 1

## 2019-05-12 MED ORDER — GABAPENTIN 400 MG PO CAPS
400.0000 mg | ORAL_CAPSULE | Freq: Three times a day (TID) | ORAL | Status: DC
  Administered 2019-05-12 – 2019-05-13 (×3): 400 mg via ORAL
  Filled 2019-05-12 (×6): qty 1

## 2019-05-12 NOTE — Tx Team (Signed)
Initial Treatment Plan 05/12/2019 2:35 AM FERMON URETA GGE:[phone]    PATIENT STRESSORS: Substance abuse   PATIENT STRENGTHS: Ability for insight Supportive family/friends   PATIENT IDENTIFIED PROBLEMS: SI  Depression        " I want to work on my anxiety"           DISCHARGE CRITERIA:  Improved stabilization in mood, thinking, and/or behavior  PRELIMINARY DISCHARGE PLAN: Return to previous living arrangement  PATIENT/FAMILY INVOLVEMENT: This treatment plan has been presented to and reviewed with the patient, KLINTON CANDELAS, and/or family member.  The patient and family have been given the opportunity to ask questions and make suggestions.  Aurora Mask, RN 05/12/2019, 2:35 AM

## 2019-05-12 NOTE — Progress Notes (Signed)
D:  Patient denied SI and HI, contracts for safety.  Denied A/V hallucinations.   A:  Medications administered per MD orders.  Emotional support and encouragement given patient. R:  Safety maintained with 15 minute checks.  
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
## 2019-05-12 NOTE — Progress Notes (Signed)
Adult Psychoeducational Group Note  Date:  05/12/2019 Time:  9:54 PM  Group Topic/Focus:  Wrap-Up Group:   The focus of this group is to help patients review their daily goal of treatment and discuss progress on daily workbooks.  Participation Level:  Active  Participation Quality:  Appropriate  Affect:  Appropriate  Cognitive:  Appropriate  Insight: Appropriate  Engagement in Group:  Engaged  Modes of Intervention:  Discussion  Additional Comments:  Patient attend wrap-up group and said that his day was a 7. His coping skills for today were attend group and watching tv.   Fidelis Loth W Taevin Mcferran 06/27/8109, 9:54 PM

## 2019-05-12 NOTE — Progress Notes (Signed)
Spiritual care group on grief and loss facilitated by chaplain Jerene Pitch  Group Goal:  Support / Education around grief and loss Members engage in facilitated group support and psycho social education.  Group Description:  Following introductions and group rules,  Group members engaged in facilitated group dialog and support around topic of loss, with particular support around experiences of loss in their lives. Group Identified types of loss (relationships / self / things) and identified patterns, circumstances, and changes that precipitate losses. Reflected on thoughts / feelings around loss, normalized grief responses, and recognized variety in grief experience. Patient Progress:  [name] was present throughout group. Engaged in facilitated discussion with chaplain and other group members.  [name] described pattern of seeking out novel experiences as distraction - spoke about this being isolating, and seeking different connections in his life.   Spoke about difficulty of being on probation and being tied to [employer]  Received affirmation for reaching out to his probation officer.

## 2019-05-12 NOTE — ED Notes (Signed)
ED TO INPATIENT HANDOFF REPORT  Name/Age/Gender [name] HousekeeperBrandon G Tuma 28 y.o. male  Code Status Code Status History    Date Active Date Inactive Code Status Order ID Comments User Context   02/27/2017 2033 02/28/2017 1441 Full Code [id number]  Nira Connardama, Pedro Eduardo, MD ED   08/06/2016 0019 08/06/2016 1730 Full Code [id number]  Palumbo, April, MD ED   07/26/2016 1951 07/27/2016 1405 Full Code [id number]  Benjiman CorePickering, Nathan, MD ED   07/25/2016 2312 07/26/2016 1702 Full Code [id number]  Mancel BaleWentz, Elliott, MD ED   04/18/2015 0538 04/19/2015 1325 Full Code [id number]  Worthy FlankNwaeze, Ijeoma E, NP Inpatient   04/18/2015 0215 04/18/2015 0538 Full Code [id number]  Earley FavorSchulz, Gail, NP ED   01/26/2015 0006 01/26/2015 1417 Full Code [id number]  Tomasita Crumbleni, Adeleke, MD ED   01/23/2015 0020 01/24/2015 1746 Full Code [id number]  Dayna BarkerNwaeze, Ijeoma Eugenia, NP Inpatient   01/22/2015 2117 01/23/2015 0020 Full Code [id number]  Purvis SheffieldHarrison, Forrest, MD ED   08/26/2014 2153 08/30/2014 1622 Full Code [id number]  Antony MaduraHumes, Kelly, PA-C ED   08/23/2014 2112 08/24/2014 1208 Full Code [id number]  Arman FilterSchulz, Gail K, NP ED   09/25/2013 0236 09/25/2013 1942 Full Code [id number]  Suzi RootsSteinl, Kevin E, MD ED   09/25/2013 0036 09/25/2013 0236 Full Code [id number]  Gilda CreasePollina, Christopher J., MD ED   09/22/2013 0038 09/22/2013 1507 Full Code [id number]  Muthersbaugh, Boyd KerbsHannah, PA-C ED   09/20/2013 0528 09/21/2013 1927 Full Code [id number]  Arman FilterSchulz, Gail K, NP ED   Advance Care Planning Activity      Home/SNF/Other Home  Chief Complaint Suicidal  Level of Care/Admitting Diagnosis ED Disposition    None      Medical History Past Medical History:  Diagnosis Date  . Anxiety   . Constipation   . Depression   . Major depressive disorder, recurrent episode, severe (HCC) 01/23/2015  . Mental disorder   . Polysubstance (excluding opioids) dependence without physiological dependence (HCC) 09/22/2013    Allergies Allergies  Allergen Reactions  . Trazodone And Nefazodone Other (See Comments)    Lethargic next  morning, dizziness, confusion    IV Location/Drains/Wounds Patient Lines/Drains/Airways Status   Active Line/Drains/Airways    None          Labs/Imaging Results for orders placed or performed during the hospital encounter of 05/11/19 (from the past 48 hour(s))  Comprehensive metabolic panel     Status: Abnormal   Collection Time: 05/11/19 11:42 PM  Result Value Ref Range   Sodium 141 135 - 145 mmol/L   Potassium 3.9 3.5 - 5.1 mmol/L   Chloride 107 98 - 111 mmol/L   CO2 21 (L) 22 - 32 mmol/L   Glucose, Bld 100 (H) 70 - 99 mg/dL   BUN 10 6 - 20 mg/dL   Creatinine, Ser 8.100.87 0.61 - 1.24 mg/dL   Calcium 9.1 8.9 - 17.510.3 mg/dL   Total Protein 7.5 6.5 - 8.1 g/dL   Albumin 4.6 3.5 - 5.0 g/dL   AST 19 15 - 41 U/L   ALT 18 0 - 44 U/L   Alkaline Phosphatase 54 38 - 126 U/L   Total Bilirubin 0.2 (L) 0.3 - 1.2 mg/dL   GFR calc non Af Amer >60 >60 mL/min   GFR calc Af Amer >60 >60 mL/min   Anion gap 13 5 - 15    Comment: Performed at [facility], [address] [place], [place] [address]  Ethanol     Status: Abnormal   Collection Time: 05/11/19 11:42 PM  Result  Value Ref Range   Alcohol, Ethyl (B) 67 (H) <10 mg/dL    Comment: (NOTE) Lowest detectable limit for serum alcohol is 10 mg/dL. For medical purposes only. Performed at [facility], [address] [place], [place] [address]   Salicylate level     Status: None   Collection Time: 05/11/19 11:42 PM  Result Value Ref Range   Salicylate Lvl <7.0 2.8 - 30.0 mg/dL    Comment: Performed at [facility], [address] [place], [place] [address]  Acetaminophen level     Status: Abnormal   Collection Time: 05/11/19 11:42 PM  Result Value Ref Range   Acetaminophen (Tylenol), Serum <10 (L) 10 - 30 ug/mL    Comment: (NOTE) Therapeutic concentrations vary significantly. A range of 10-30 ug/mL  may be an effective concentration for many patients. However, some  are best  treated at concentrations outside of this range. Acetaminophen concentrations >150 ug/mL at 4 hours after ingestion  and >50 ug/mL at 12 hours after ingestion are often associated with  toxic reactions. Performed at [facility], [address] [place], [place] [address]   cbc     Status: None   Collection Time: 05/11/19 11:42 PM  Result Value Ref Range   WBC 8.2 4.0 - 10.5 K/uL   RBC 5.14 4.22 - 5.81 MIL/uL   Hemoglobin 16.0 13.0 - 17.0 g/dL   HCT 21.347.2 08.639.0 - 57.852.0 %   MCV 91.8 80.0 - 100.0 fL   MCH 31.1 26.0 - 34.0 pg   MCHC 33.9 30.0 - 36.0 g/dL   RDW 46.912.8 62.911.5 - 52.815.5 %   Platelets 326 150 - 400 K/uL   nRBC 0.0 0.0 - 0.2 %    Comment: Performed at [facility], [address] [place], [place] [address]  SARS Coronavirus 2 (CEPHEID - Performed in [facility] hospital lab), Hosp Order     Status: None   Collection Time: 05/11/19 11:42 PM   Specimen: Nasopharyngeal Swab  Result Value Ref Range   SARS Coronavirus 2 NEGATIVE NEGATIVE    Comment: (NOTE) If result is NEGATIVE SARS-CoV-2 target nucleic acids are NOT DETECTED. The SARS-CoV-2 RNA is generally detectable in upper and lower  respiratory specimens during the acute phase of infection. The lowest  concentration of SARS-CoV-2 viral copies this assay can detect is 250  copies / mL. A negative result does not preclude SARS-CoV-2 infection  and should not be used as the sole basis for treatment or other  patient management decisions.  A negative result may occur with  improper specimen collection / handling, submission of specimen other  than nasopharyngeal swab, presence of viral mutation(s) within the  areas targeted by this assay, and inadequate number of viral copies  (<250 copies / mL). A negative result must be combined with clinical  observations, patient history, and epidemiological information. If result is POSITIVE SARS-CoV-2 target nucleic acids are DETECTED. The SARS-CoV-2  RNA is generally detectable in upper and lower  respiratory specimens dur ing the acute phase of infection.  Positive  results are indicative of active infection with SARS-CoV-2.  Clinical  correlation with patient history and other diagnostic information is  necessary to determine patient infection status.  Positive results do  not rule out bacterial infection or co-infection with other viruses. If result is PRESUMPTIVE POSTIVE SARS-CoV-2 nucleic acids MAY BE PRESENT.   A presumptive positive result was obtained on the submitted specimen  and confirmed on  repeat testing.  While 2019 novel coronavirus  (SARS-CoV-2) nucleic acids may be present in the submitted sample  additional confirmatory testing may be necessary for epidemiological  and / or clinical management purposes  to differentiate between  SARS-CoV-2 and other Sarbecovirus currently known to infect humans.  If clinically indicated additional testing with an alternate test  methodology [phone]) is advised. The SARS-CoV-2 RNA is generally  detectable in upper and lower respiratory sp ecimens during the acute  phase of infection. The expected result is Negative. Fact Sheet for Patients:  StrictlyIdeas.no Fact Sheet for Healthcare Providers: BankingDealers.co.za This test is not yet approved or cleared by the [place] FDA and has been authorized for detection and/or diagnosis of SARS-CoV-2 by FDA under an Emergency Use Authorization (EUA).  This EUA will remain in effect (meaning this test can be used) for the duration of the COVID-19 declaration under Section 564(b)(1) of the Act, 21 U.S.C. section 360bbb-3(b)(1), unless the authorization is terminated or revoked sooner. Performed at [facility], [place] [address], [place], [place] [address]   Rapid urine drug screen (hospital performed)     Status: None   Collection Time: 05/12/19 12:08 AM  Result Value  Ref Range   Opiates NONE DETECTED NONE DETECTED   Cocaine NONE DETECTED NONE DETECTED   Benzodiazepines NONE DETECTED NONE DETECTED   Amphetamines NONE DETECTED NONE DETECTED   Tetrahydrocannabinol NONE DETECTED NONE DETECTED   Barbiturates NONE DETECTED NONE DETECTED    Comment: (NOTE) DRUG SCREEN FOR MEDICAL PURPOSES ONLY.  IF CONFIRMATION IS NEEDED FOR ANY PURPOSE, NOTIFY LAB WITHIN 5 DAYS. LOWEST DETECTABLE LIMITS FOR URINE DRUG SCREEN Drug Class                     Cutoff (ng/mL) Amphetamine and metabolites    1000 Barbiturate and metabolites    200 Benzodiazepine                 952 Tricyclics and metabolites     300 Opiates and metabolites        300 Cocaine and metabolites        300 THC                            50 Performed at [facility], [place] [address], [place], [place] [address]    No results found.  Pending Labs Unresulted Labs (From admission, onward)   None      Vitals/Pain Today's Vitals   05/11/19 2211  BP: 126/88  Pulse: 85  Resp: 15  Temp: 98.1 F (36.7 C)  TempSrc: Oral  SpO2: 97%  Weight: 72.6 kg  Height: 5\' 9"  (1.753 m)    Isolation Precautions No active isolations  Medications Medications - No data to display  Mobility walks

## 2019-05-12 NOTE — Tx Team (Signed)
Interdisciplinary Treatment and Diagnostic Plan Update  05/12/2019 Time of Session: 9:00AM [name] MRN: [id number]  Principal Diagnosis: <principal problem not specified>  Secondary Diagnoses: Active Problems:   MDD (major depressive disorder), recurrent, severe, with psychosis ([place])   Schizoaffective disorder, bipolar type ([place])   Current Medications:  Current Facility-Administered Medications  Medication Dose Route Frequency Provider Last Rate Last Dose  . acetaminophen (TYLENOL) tablet 650 mg  650 mg Oral Q6H PRN Dara Hoyer, PA-C      . alum & mag hydroxide-simeth (MAALOX/MYLANTA) 200-200-20 MG/5ML suspension 30 mL  30 mL Oral Q4H PRN Dara Hoyer, PA-C      . gabapentin (NEURONTIN) capsule 400 mg  400 mg Oral TID Johnn Hai, MD   400 mg at 05/12/19 1210  . iloperidone (FANAPT) tablet 2 mg  2 mg Oral Q4H PRN Johnn Hai, MD      . magnesium hydroxide (MILK OF MAGNESIA) suspension 30 mL  30 mL Oral Daily PRN Dara Hoyer, PA-C       PTA Medications: Medications Prior to Admission  Medication Sig Dispense Refill Last Dose  . polyethylene glycol (MIRALAX / GLYCOLAX) packet 17 g mixed in 12 ounces of water.  Take hourly until you have a bowel movement. (Patient not taking: Reported on 05/11/2019) 14 each 0 Unknown at Unknown time    Patient Stressors: Substance abuse  Patient Strengths: Ability for insight Supportive family/friends  Treatment Modalities: Medication Management, Group therapy, Case management,  1 to 1 session with clinician, Psychoeducation, Recreational therapy.   Physician Treatment Plan for Primary Diagnosis: <principal problem not specified> Long Term Goal(s): Improvement in symptoms so as ready for discharge Improvement in symptoms so as ready for discharge   Short Term Goals: Ability to disclose and discuss suicidal ideas Ability to demonstrate self-control will improve Ability to identify and develop effective coping behaviors  will improve Ability to identify and develop effective coping behaviors will improve Compliance with prescribed medications will improve  Medication Management: Evaluate patient's response, side effects, and tolerance of medication regimen.  Therapeutic Interventions: 1 to 1 sessions, Unit Group sessions and Medication administration.  Evaluation of Outcomes: Not Met  Physician Treatment Plan for Secondary Diagnosis: Active Problems:   MDD (major depressive disorder), recurrent, severe, with psychosis ([place])   Schizoaffective disorder, bipolar type ([place])  Long Term Goal(s): Improvement in symptoms so as ready for discharge Improvement in symptoms so as ready for discharge   Short Term Goals: Ability to disclose and discuss suicidal ideas Ability to demonstrate self-control will improve Ability to identify and develop effective coping behaviors will improve Ability to identify and develop effective coping behaviors will improve Compliance with prescribed medications will improve     Medication Management: Evaluate patient's response, side effects, and tolerance of medication regimen.  Therapeutic Interventions: 1 to 1 sessions, Unit Group sessions and Medication administration.  Evaluation of Outcomes: Not Met   RN Treatment Plan for Primary Diagnosis: <principal problem not specified> Long Term Goal(s): Knowledge of disease and therapeutic regimen to maintain health will improve  Short Term Goals: Ability to remain free from injury will improve, Ability to verbalize frustration and anger appropriately will improve, Ability to identify and develop effective coping behaviors will improve and Compliance with prescribed medications will improve  Medication Management: RN will administer medications as ordered by provider, will assess and evaluate patient's response and provide education to patient for prescribed medication. RN will report any adverse and/or side effects to prescribing  provider.  Therapeutic Interventions: 1 on 1 counseling sessions, Psychoeducation, Medication administration, Evaluate responses to treatment, Monitor vital signs and CBGs as ordered, Perform/monitor CIWA, COWS, AIMS and Fall Risk screenings as ordered, Perform wound care treatments as ordered.  Evaluation of Outcomes: Not Met   LCSW Treatment Plan for Primary Diagnosis: <principal problem not specified> Long Term Goal(s): Safe transition to appropriate next level of care at discharge, Engage patient in therapeutic group addressing interpersonal concerns.  Short Term Goals: Engage patient in aftercare planning with referrals and resources, Increase social support, Identify triggers associated with mental health/substance abuse issues and Increase skills for wellness and recovery  Therapeutic Interventions: Assess for all discharge needs, 1 to 1 time with Social worker, Explore available resources and support systems, Assess for adequacy in community support network, Educate family and significant other(s) on suicide prevention, Complete Psychosocial Assessment, Interpersonal group therapy.  Evaluation of Outcomes: Not Met   Progress in Treatment: Attending groups: No. Participating in groups: No. Taking medication as prescribed: Yes. Toleration medication: Yes. Family/Significant other contact made: No, will contact:  supports if consents are granted Patient understands diagnosis: Yes. Discussing patient identified problems/goals with staff: Yes. Medical problems stabilized or resolved: Yes. Denies suicidal/homicidal ideation: Yes. Issues/concerns per patient self-inventory: Yes.   New problem(s) identified: Yes, Describe:  CSW continuing to assess  New Short Term/Long Term Goal(s): detox, medication management for mood stabilization; elimination of SI thoughts; development of comprehensive mental wellness/sobriety plan.  Patient Goals:    Discharge Plan or Barriers: CSW  continuing to assess for appropriate referrals.  Reason for Continuation of Hospitalization: Anxiety Depression  Estimated Length of Stay: 3-5 days  Attendees: Patient: 05/12/2019 12:46 PM  Physician:  05/12/2019 12:46 PM  Nursing:  05/12/2019 12:46 PM  RN Care Manager: 05/12/2019 12:46 PM  Social Worker: Stephanie Acre, [place] 05/12/2019 12:46 PM  Recreational Therapist:  05/12/2019 12:46 PM  Other:  05/12/2019 12:46 PM  Other:  05/12/2019 12:46 PM  Other: 05/12/2019 12:46 PM    Scribe for Treatment Team: Joellen [place], [place] 05/12/2019 12:46 PM

## 2019-05-12 NOTE — Progress Notes (Signed)
Patient is a 28 year old male admitted voluntarily reporting SI with no specific plan. He has been off his medications for almost 2 years due to being incarcerated. He reports that hears voices but not currently. He c/o having bad anxiety. Skin assessment completed, patient has various tattoos on his body. Patient oriented to unit and safety maintained with 15 min checks.

## 2019-05-12 NOTE — Plan of Care (Signed)
Nurse discussed anxiety, depression and coping skills with patient.  

## 2019-05-12 NOTE — [facility] Suicide Risk Assessment (Signed)
[facility] Admission Suicide Risk Assessment   Principal Problem: Probable bipolar Discharge Diagnoses: Active Problems:   MDD (major depressive disorder), recurrent, severe, with psychosis ([place])   Schizoaffective disorder, bipolar type ([place])   Total Time spent with patient: 45 minutes  Musculoskeletal: Strength & Muscle Tone: within normal limits Gait & Station: normal Patient leans: N/A  Psychiatric Specialty Exam: Physical Exam  ROS  Blood pressure 126/89, pulse 78, temperature (!) 97.5 F (36.4 C), temperature source Oral, resp. rate 16, height 5\' 9"  (1.753 m), weight 76.2 kg, SpO2 98 %.Body mass index is 24.81 kg/m.  General Appearance: Casual  Eye Contact:  Good  Speech:  Clear and Coherent  Volume:  Normal  Mood:  Euthymic  Affect:  Congruent  Thought Process:  Coherent and Descriptions of Associations: Circumstantial  Orientation:  Full (Time, Place, and Person)  Thought Content:  Logical  Suicidal Thoughts:  No  Homicidal Thoughts:  No  Memory:  Immediate;   Fair  Judgement:  Fair  Insight:  Fair  Psychomotor Activity:  Normal  Concentration:  Concentration: Fair  Recall:  [employer] of Knowledge:  Fair  Language:  Fair  Akathisia:  Negative  Handed:  Right  AIMS (if indicated):     Assets:  Physical Health Resilience  ADL's:  Intact  Cognition:  WNL  Sleep:  Number of Hours: 2.25    Mental Status Per Nursing Assessment::   On Admission:  Suicidal ideation indicated by patient  Demographic Factors:  Male  Loss Factors: Decrease in vocational status  Historical Factors: Impulsivity  Risk Reduction Factors:   Sense of responsibility to family and Religious beliefs about death  Continued Clinical Symptoms:  Alcohol/Substance Abuse/Dependencies  Cognitive Features That Contribute To Risk:  None    Suicide Risk:  Minimal: No identifiable suicidal ideation.  Patients presenting with no risk factors but with morbid ruminations; may be classified  as minimal risk based on the severity of the depressive symptoms    Plan Of Care/Follow-up recommendations:  Activity:  full  Venie Montesinos, MD 05/12/2019, 9:56 AM

## 2019-05-13 MED ORDER — CAPLYTA 42 MG PO CAPS: 1.0000 | ORAL_CAPSULE | Freq: Every day | ORAL | 5 refills | Status: AC

## 2019-05-13 MED ORDER — GABAPENTIN 400 MG PO CAPS: 400.0000 mg | ORAL_CAPSULE | Freq: Three times a day (TID) | ORAL | 2 refills | Status: AC

## 2019-05-13 NOTE — Plan of Care (Signed)
Discharge note  Patient verbalizes readiness for discharge. Follow up plan explained, AVS, Transition record and SRA given. Prescriptions and teaching provided. Belongings returned and signed for. Suicide safety plan completed and signed. Patient verbalizes understanding. Patient denies SI/HI and assures this Probation officer he will seek assistance should that change. Patient discharged to lobby with directions to the bus stop.  Problem: Education: Goal: Knowledge of [place] [facility] Education information/materials will improve Outcome: Adequate for Discharge Goal: Emotional status will improve Outcome: Adequate for Discharge Goal: Mental status will improve Outcome: Adequate for Discharge Goal: Verbalization of understanding the information provided will improve Outcome: Adequate for Discharge   Problem: Activity: Goal: Interest or engagement in activities will improve Outcome: Adequate for Discharge Goal: Sleeping patterns will improve Outcome: Adequate for Discharge   Problem: Coping: Goal: Ability to verbalize frustrations and anger appropriately will improve Outcome: Adequate for Discharge Goal: Ability to demonstrate self-control will improve Outcome: Adequate for Discharge   Problem: Health Behavior/Discharge Planning: Goal: Identification of resources available to assist in meeting health care needs will improve Outcome: Adequate for Discharge Goal: Compliance with treatment plan for underlying cause of condition will improve Outcome: Adequate for Discharge   Problem: Physical Regulation: Goal: Ability to maintain clinical measurements within normal limits will improve Outcome: Adequate for Discharge   Problem: Safety: Goal: Periods of time without injury will increase Outcome: Adequate for Discharge   Problem: Education: Goal: Ability to make informed decisions regarding treatment will improve Outcome: Adequate for Discharge   Problem: Coping: Goal: Coping ability will  improve Outcome: Adequate for Discharge   Problem: Health Behavior/Discharge Planning: Goal: Identification of resources available to assist in meeting health care needs will improve Outcome: Adequate for Discharge   Problem: Medication: Goal: Compliance with prescribed medication regimen will improve Outcome: Adequate for Discharge   Problem: Self-Concept: Goal: Ability to disclose and discuss suicidal ideas will improve Outcome: Adequate for Discharge Goal: Will verbalize positive feelings about self Outcome: Adequate for Discharge   Problem: Education: Goal: Utilization of techniques to improve thought processes will improve Outcome: Adequate for Discharge Goal: Knowledge of the prescribed therapeutic regimen will improve Outcome: Adequate for Discharge   Problem: Activity: Goal: Interest or engagement in leisure activities will improve Outcome: Adequate for Discharge Goal: Imbalance in normal sleep/wake cycle will improve Outcome: Adequate for Discharge   Problem: Coping: Goal: Coping ability will improve Outcome: Adequate for Discharge Goal: Will verbalize feelings Outcome: Adequate for Discharge   Problem: Health Behavior/Discharge Planning: Goal: Ability to make decisions will improve Outcome: Adequate for Discharge Goal: Compliance with therapeutic regimen will improve Outcome: Adequate for Discharge   Problem: Role Relationship: Goal: Will demonstrate positive changes in social behaviors and relationships Outcome: Adequate for Discharge   Problem: Safety: Goal: Ability to disclose and discuss suicidal ideas will improve Outcome: Adequate for Discharge Goal: Ability to identify and utilize support systems that promote safety will improve Outcome: Adequate for Discharge   Problem: Self-Concept: Goal: Will verbalize positive feelings about self Outcome: Adequate for Discharge Goal: Level of anxiety will decrease Outcome: Adequate for Discharge

## 2019-05-13 NOTE — [facility] Suicide Risk Assessment (Signed)
[facility] Discharge Suicide Risk Assessment   Principal Problem: History of schizoaffective disorder presented requesting medications for anxiety Discharge Diagnoses: Active Problems:   MDD (major depressive disorder), recurrent, severe, with psychosis ([place])   Schizoaffective disorder, bipolar type ([place])   Total Time spent with patient: 45 minutes  Musculoskeletal: Strength & Muscle Tone: within normal limits Gait & Station: normal Patient leans: N/A  Psychiatric Specialty Exam: ROS  Blood pressure 109/86, pulse 74, temperature 97.6 F (36.4 C), temperature source Oral, resp. rate 16, height 5\' 9"  (1.753 m), weight 76.2 kg, SpO2 99 %.Body mass index is 24.81 kg/m.  General Appearance: Casual  Eye Contact::  Good  Speech:  Clear and [id number]  Volume:  Normal  Mood:  Euthymic  Affect:  Constricted  Thought Process:  Coherent  Orientation:  Full (Time, Place, and Person)  Thought Content:  Tangential  Suicidal Thoughts:  No  Homicidal Thoughts:  No  Memory:  Immediate;   Fair  Judgement:  Fair  Insight:  Fair  Psychomotor Activity:  Normal  Concentration:  Fair  Recall:  [employer] [facility] [place] [address] [place]  Language: Fair  Akathisia:  Negative  Handed:  Right  AIMS (if indicated):     Assets:  Communication Skills Housing Leisure Time Physical Health Resilience Social Support  Sleep:  Number of Hours: 6.75  Cognition: WNL  ADL's:  Intact   Mental Status Per Nursing Assessment::   On Admission:  Suicidal ideation indicated by patient  Demographic Factors:  Male  Loss Factors: Decrease in vocational status  Historical Factors: NA  Risk Reduction Factors:   Positive social support  Continued Clinical Symptoms:  Alcohol/Substance Abuse/Dependencies  Cognitive Features That Contribute To Risk:  None    Suicide Risk:  Minimal: No identifiable suicidal ideation.  Patients presenting with no risk factors but with morbid ruminations; may be classified as minimal  risk based on the severity of the depressive symptoms    Plan Of Care/Follow-up recommendations:  Activity:  Misty Stanley, MD 05/13/2019, 7:44 AM

## 2019-05-13 NOTE — Discharge Summary (Signed)
Physician Discharge Summary Note  Patient:  [name] is an 28 y.o., male MRN:  [id number] DOB:  [name] Patient phone:  [phone] (home)  Patient address:   [address] [place] [id number],  Total Time spent with patient: 45 minutes  Date of Admission:  05/12/2019 Date of Discharge: 05/13/2019  Reason for Admission:    This was the latest of numerous psychiatric admissions and encounters for [name], a 28 year old individual who presented voluntarily on 6/21 through the emergency department requesting a psychiatric admission, he reported a history of a schizoaffective condition, recent travels in order to join protest, and a history of paranoia.  He came to our unit and it almost immediately asks for discharge, he then wanted something for anxiety that was "not addicting" again he was a bit scattered as far as his specific complaints, but behaviorally contained See admission note for further details.  Drug screen negative for all compounds  Principal Problem: Initially paranoia then complained of anxiety Discharge Diagnoses: Active Problems:   MDD (major depressive disorder), recurrent, severe, with psychosis ([facility])   Schizoaffective disorder, bipolar type ([facility])   Past Psychiatric History: Insisted he would not take antipsychotics  Past Medical History:  Past Medical History:  Diagnosis Date  . Anxiety   . Constipation   . Depression   . Major depressive disorder, recurrent episode, severe ([facility]) 01/23/2015  . Mental disorder   . Polysubstance (excluding opioids) dependence without physiological dependence ([facility]) 09/22/2013   History reviewed. No pertinent surgical history. Family History: History reviewed. No pertinent family history. Family Psychiatric  History: ukn Social History:  Social History   Substance and Sexual Activity  Alcohol Use Yes  . Alcohol/week: 24.0 standard drinks  . Types: 24 Cans of beer per week   Comment: case daily      Social History   Substance and Sexual Activity  Drug Use Yes  . Types: LSD, Marijuana   Comment: reports quitting alcohol and drugs 6 months ago    Social History   Socioeconomic History  . Marital status: Single    Spouse name: Not on file  . Number of children: Not on file  . Years of education: Not on file  . Highest education level: 12th grade  Occupational History  . Not on file  Social Needs  . Financial resource strain: Not on file  . Food insecurity    Worry: Patient refused    Inability: Patient refused  . Transportation needs    Medical: Patient refused    Non-medical: Patient refused  Tobacco Use  . Smoking status: Former Smoker    Packs/day: 0.50    Years: 11.00    Pack years: 5.50    Types: Cigarettes  . Smokeless tobacco: Never Used  Substance and Sexual Activity  . Alcohol use: Yes    Alcohol/week: 24.0 standard drinks    Types: 24 Cans of beer per week    Comment: case daily  . Drug use: Yes    Types: LSD, Marijuana    Comment: reports quitting alcohol and drugs 6 months ago  . Sexual activity: Yes    Birth control/protection: None  Lifestyle  . Physical activity    Days per week: Patient refused    Minutes per session: Patient refused  . Stress: Not on file  Relationships  . Social connections    Talks on phone: Patient refused    Gets together: Patient refused    Attends religious service: Patient refused  Active member of club or organization: Patient refused    Attends meetings of clubs or organizations: Patient refused    Relationship status: Patient refused  Other Topics Concern  . Not on file  Social History Narrative  . Not on file    Hospital Course:    Again almost immediately the patient was requesting discharge and requesting medications for anxiety.  His blood alcohol level was 67 but he stated he did not need detox measures. He was given gabapentin for alcohol cravings and anxiety and he found it extremely successful,  by the morning of the 23rd he continued to lobby for discharge.  In fact he had tried to corner me several times in the afternoon of 6/22 and ask for discharge although he was generally cordial and jovial about this but he was not manic at any point in time. By the date of the 23rd he again insisted on discharge denied thoughts of harming self or others denied paranoid thoughts stated that he "Neurontin is working" and simply wanted a prescription for that.  I discussed with him if he did indeed have a history of schizoaffective diagnoses and further he had recently been a bit disorganized in his ramblings it would be a good idea to go ahead and try caplyta, which is a new antipsychotic and he is going to consider that  Physical Findings: AIMS: Facial and Oral Movements Muscles of Facial Expression: None, normal Lips and Perioral Area: None, normal Jaw: None, normal Tongue: None, normal,Extremity Movements Upper (arms, wrists, hands, fingers): None, normal Lower (legs, knees, ankles, toes): None, normal, Trunk Movements Neck, shoulders, hips: None, normal, Overall Severity Severity of abnormal movements (highest score from questions above): None, normal Incapacitation due to abnormal movements: None, normal Patient's awareness of abnormal movements (rate only patient's report): No Awareness, Dental Status Current problems with teeth and/or dentures?: No Does patient usually wear dentures?: No  CIWA:  CIWA-Ar Total: 1 COWS:  COWS Total Score: 1 Musculoskeletal: Strength & Muscle Tone: within normal limits Gait & Station: normal Patient leans: N/A  Psychiatric Specialty Exam: ROS  Blood pressure 109/86, pulse 74, temperature 97.6 F (36.4 C), temperature source Oral, resp. rate 16, height 5\' 9"  (1.753 m), weight 76.2 kg, SpO2 99 %.Body mass index is 24.81 kg/m.  General Appearance: Casual  Eye Contact::  Good  Speech:  Clear and [id number]  Volume:  Normal  Mood:  Euthymic  Affect:   Constricted  Thought Process:  Coherent  Orientation:  Full (Time, Place, and Person)  Thought Content:  Tangential  Suicidal Thoughts:  No  Homicidal Thoughts:  No  Memory:  Immediate;   Fair  Judgement:  Fair  Insight:  Fair  Psychomotor Activity:  Normal  Concentration:  Fair  Recall:  [employer] of [place]  Language: Fair  Akathisia:  Negative  Handed:  Right  AIMS (if indicated):     Assets:  Communication Skills Housing Leisure Time Physical Health Resilience Social Support  Sleep:  Number of Hours: 6.75  Cognition: WNL  ADL's:  Intact     Have you used any form of tobacco in the last 30 days? (Cigarettes, Smokeless Tobacco, Cigars, and/or Pipes): Yes  Has this patient used any form of tobacco in the last 30 days? (Cigarettes, Smokeless Tobacco, Cigars, and/or Pipes) Yes, No  Blood Alcohol level:  Lab Results  Component Value Date   ETH 67 (H) 05/11/2019   ETH 18 (H) 17/51/0258    Metabolic Disorder Labs:  No results found for: HGBA1C, MPG No results found for: PROLACTIN No results found for: CHOL, TRIG, HDL, CHOLHDL, VLDL, LDLCALC  See Psychiatric Specialty Exam and Suicide Risk Assessment completed by Attending Physician prior to discharge.  Discharge destination:  Home  Is patient on multiple antipsychotic therapies at discharge:  No   Has Patient had three or more failed trials of antipsychotic monotherapy by history:  No  Recommended Plan for Multiple Antipsychotic Therapies: NA   Allergies as of 05/13/2019      Reactions   Trazodone And Nefazodone Other (See Comments)   Lethargic next morning, dizziness, confusion      Medication List    TAKE these medications     Indication  Caplyta 42 MG Caps Generic drug: Lumateperone Tosylate Take 1 capsule by mouth daily at 12 noon.  Indication: Schizophrenia   gabapentin 400 MG capsule Commonly known as: NEURONTIN Take 1 capsule (400 mg total) by mouth 3 (three) times daily.   Indication: Abuse or Misuse of Alcohol   polyethylene glycol 17 g packet Commonly known as: MIRALAX / GLYCOLAX 17 g mixed in 12 ounces of water.  Take hourly until you have a bowel movement.  Indication: Constipation        Follow-up recommendations:  Activity:  full  Comments: History of polysubstance abuse history of schizoaffective disorder, at this point showing no acute psychosis or acute dangerousness  SignedMalvin Johns: Leen Tworek, MD 05/13/2019, 7:46 AM

## 2019-05-13 NOTE — [facility] Suicide Risk Assessment (Signed)
[facility] INPATIENT:  Family/Significant Other Suicide Prevention Education  Suicide Prevention Education:  Patient Refusal for Family/Significant Other Suicide Prevention Education: The patient [name] has refused to provide written consent for family/significant other to be provided Family/Significant Other Suicide Prevention Education during admission and/or prior to discharge.  Physician notified.  Joellen Jersey 05/13/2019, 9:04 AM

## 2019-05-13 NOTE — Plan of Care (Signed)
D: Patient is alert, oriented, pleasant, and cooperative. Denies SI, HI, AVH, and verbally contracts for safety. Patient reports he/she slept .. last night with/without sleeping medication. Patient denies physical symptoms/pain.    A: Medications administered per MD order. Support provided. Patient educated on safety on the unit and medications. Routine safety checks every 15 minutes. Patient stated understanding to tell nurse about any new physical symptoms. Patient understands to tell staff of any needs.     R: No adverse drug reactions noted. Patient verbally contracts for safety. Patient remains safe at this time and will continue to monitor.   Problem: Education: Goal: Knowledge of [place] [facility] Education information/materials will improve Outcome: Progressing   Problem: Safety: Goal: Periods of time without injury will increase Outcome: Progressing   Patient oriented to the unit. Patient remains safe and will continue to monitor.   [place] NOVEL CORONAVIRUS (COVID-19) DAILY CHECK-OFF SYMPTOMS - answer yes or no to each - every day NO YES  Have you had a fever in the past 24 hours?  Fever (Temp > 37.80C / 100F) X   Have you had any of these symptoms in the past 24 hours? New Cough  Sore Throat   Shortness of Breath  Difficulty Breathing  Unexplained Body Aches   X   Have you had any one of these symptoms in the past 24 hours not related to allergies?   Runny Nose  Nasal Congestion  Sneezing   X   If you have had runny nose, nasal congestion, sneezing in the past 24 hours, has it worsened?  X   EXPOSURES - check yes or no X   Have you traveled outside the state in the past 14 days?  X   Have you been in contact with someone with a confirmed diagnosis of COVID-19 or PUI in the past 14 days without wearing appropriate PPE?  X   Have you been living in the same home as a person with confirmed diagnosis of COVID-19 or a PUI (household contact)?    X   Have you been  diagnosed with COVID-19?    X              What to do next: Answered NO to all: Answered YES to anything:   Proceed with unit schedule Follow the [facility] Inpatient Flowsheet.

## 2019-05-13 NOTE — Progress Notes (Signed)
  [facility] Adult Case Management Discharge Plan :  Will you be returning to the same living situation after discharge:  No. Going to stay with friends. At discharge, do you have transportation home?: Yes,  bus is free Do you have the ability to pay for your medications: No. Provided samples and referred to [facility]  Release of information consent forms completed and in the chart;letter on chart. Patient to Follow up at: Follow-up Information    [facility] Follow up on 05/26/2019.   Why: Telephonic hospital follow up appointment is Monday, 7/6 at 1:30p.  The provider will contact you the day of the appointment. Contact information: [place] [phone]           Next level of care provider has access to [place] and Suicide Prevention discussed: Yes,  with patient.  Have you used any form of tobacco in the last 30 days? (Cigarettes, Smokeless Tobacco, Cigars, and/or Pipes): Yes  Has patient been referred to the Quitline?: Patient refused referral  Patient has been referred for addiction treatment: Yes  Joellen Jersey, [place] 05/13/2019, 9:40 AM

## 2019-05-13 NOTE — Progress Notes (Signed)
Patient is discharging within 36 hours of admission. As such, a PSA was not completed with patient. Patient declined collateral consents for SPE, SPE pamphlet placed on chart to share with supports.  Patient is agreeable to follow up with [facility] for outpatient.  Stephanie Acre, LCSW-A Clinical Social Worker

## 2019-06-11 ENCOUNTER — Other Ambulatory Visit: Payer: Self-pay | Admitting: Internal Medicine

## 2019-06-11 DIAGNOSIS — Z20822 Contact with and (suspected) exposure to covid-19: Secondary | ICD-10-CM

## 2019-06-15 LAB — NOVEL CORONAVIRUS, NAA: SARS-CoV-2, NAA: NOT DETECTED

## 2020-03-20 ENCOUNTER — Emergency Department (HOSPITAL_COMMUNITY)
Admission: EM | Admit: 2020-03-20 | Discharge: 2020-03-20 | Disposition: A | Discharge status: Home / Self Care | Payer: Medicaid Other | Attending: Emergency Medicine | Admitting: Emergency Medicine

## 2020-03-20 ENCOUNTER — Other Ambulatory Visit: Payer: Self-pay

## 2020-03-20 ENCOUNTER — Encounter (HOSPITAL_COMMUNITY): Payer: Self-pay

## 2020-03-20 ENCOUNTER — Emergency Department (HOSPITAL_COMMUNITY): Payer: Medicaid Other

## 2020-03-20 DIAGNOSIS — M940 Chondrocostal junction syndrome [Tietze]: Secondary | ICD-10-CM | POA: Diagnosis not present

## 2020-03-20 DIAGNOSIS — R509 Fever, unspecified: Secondary | ICD-10-CM | POA: Diagnosis present

## 2020-03-20 DIAGNOSIS — M7918 Myalgia, other site: Secondary | ICD-10-CM | POA: Insufficient documentation

## 2020-03-20 DIAGNOSIS — Z87891 Personal history of nicotine dependence: Secondary | ICD-10-CM | POA: Insufficient documentation

## 2020-03-20 DIAGNOSIS — U071 COVID-19: Secondary | ICD-10-CM | POA: Diagnosis not present

## 2020-03-20 DIAGNOSIS — R05 Cough: Secondary | ICD-10-CM | POA: Diagnosis not present

## 2020-03-20 LAB — BASIC METABOLIC PANEL
Anion gap: 11 (ref 5–15)
BUN: 8 mg/dL (ref 6–20)
CO2: 27 mmol/L (ref 22–32)
Calcium: 9.2 mg/dL (ref 8.9–10.3)
Chloride: 102 mmol/L (ref 98–111)
Creatinine, Ser: 1.05 mg/dL (ref 0.61–1.24)
GFR calc Af Amer: >60 mL/min (ref >60)
GFR calc non Af Amer: >60 mL/min (ref >60)
Glucose, Bld: 95 mg/dL (ref 70–99)
Potassium: 4.1 mmol/L (ref 3.5–5.1)
Sodium: 140 mmol/L (ref 135–145)

## 2020-03-20 LAB — CBC
HCT: 47.3 % (ref 39.0–52.0)
Hemoglobin: 16.2 g/dL (ref 13.0–17.0)
MCH: 30.4 pg (ref 26.0–34.0)
MCHC: 34.2 g/dL (ref 30.0–36.0)
MCV: 88.7 fL (ref 80.0–100.0)
Platelets: 185 10*3/uL (ref 150–400)
RBC: 5.33 MIL/uL (ref 4.22–5.81)
RDW: 12.3 % (ref 11.5–15.5)
WBC: 3.1 10*3/uL — ABNORMAL LOW (ref 4.0–10.5)
nRBC: 0 % (ref 0.0–0.2)

## 2020-03-20 LAB — TROPONIN I (HIGH SENSITIVITY): Troponin I (High Sensitivity): 5 ng/L (ref <18)

## 2020-03-20 MED ORDER — IBUPROFEN 400 MG PO TABS
600.0000 mg | ORAL_TABLET | Freq: Once | ORAL | Status: AC
  Administered 2020-03-20: 600 mg via ORAL
  Filled 2020-03-20: qty 1

## 2020-03-20 MED ORDER — FAMOTIDINE 20 MG PO TABS
20.0000 mg | ORAL_TABLET | Freq: Once | ORAL | Status: AC
  Administered 2020-03-20: 20 mg via ORAL
  Filled 2020-03-20: qty 1

## 2020-03-20 MED ORDER — SODIUM CHLORIDE 0.9% FLUSH: 3.0000 mL | Freq: Once | INTRAVENOUS | Status: DC

## 2020-03-20 MED ORDER — ALUM & MAG HYDROXIDE-SIMETH 200-200-20 MG/5ML PO SUSP
30.0000 mL | Freq: Once | ORAL | Status: AC
  Administered 2020-03-20: 30 mL via ORAL
  Filled 2020-03-20: qty 30

## 2020-03-20 NOTE — Discharge Instructions (Addendum)
It was our pleasure to provide your ER care today - we hope that you feel better.  Your heart/lungs sound good, and your oxygen levels are excellent.   Take acetaminophen or ibuprofen as need. Stay active, take full and deep breaths.   See covid instructions and precautions.   Return to ER if worse, increased trouble breathing, or other concern.

## 2020-03-20 NOTE — ED Triage Notes (Signed)
Pt reports he was dx 03/16/2020 with covid. Presents today with ongoing chest pain, fever, chills, lost of taste and smell. Pt denies taking any OYC medications

## 2020-03-20 NOTE — ED Provider Notes (Addendum)
[facility] Provider Note   CSN: [id number] Arrival date & time: 03/20/20  1555     History Chief Complaint  Patient presents with  . Chest Pain  . Fever  . Covid Exposure    [name] is a 29 y.o. male.  Patient with recently diagnosed covid infection 4/27, c/o persistent non prod cough, chest soreness w coughing, low grade fevers, body aches, mild loss of taste and smell. Symptoms acute onset several days ago, moderate, persistent, felt worse today. Denies sob. No vomiting or diarrhea. No leg pain or swelling. Chest soreness worse w cough and palpation chest wall - no change or worsening w deep breaths, no change whether upright or supine.   The history is provided by the patient.  Chest Pain Associated symptoms: cough and fever   Associated symptoms: no abdominal pain, no headache, no shortness of breath and no vomiting   Fever Associated symptoms: chest pain, cough and myalgias   Associated symptoms: no confusion, no diarrhea, no dysuria, no headaches, no rash, no sore throat and no vomiting        Past Medical History:  Diagnosis Date  . Anxiety   . Constipation   . Depression   . Major depressive disorder, recurrent episode, severe ([place]) 01/23/2015  . Mental disorder   . Polysubstance (excluding opioids) dependence without physiological dependence ([place]) 09/22/2013    Patient Active Problem List   Diagnosis Date Noted  . Schizoaffective disorder, bipolar type ([place])   . MDD (major depressive disorder), recurrent, severe, with psychosis ([place]) 05/11/2019  . Substance induced mood disorder ([place]) 04/18/2015  . GAD (generalized anxiety disorder) 04/18/2015  . Major depressive disorder, recurrent episode, moderate ([place])   . Major depressive disorder, recurrent episode, severe ([place]) 01/23/2015    Class: Acute  . Major depressive disorder, recurrent ([place]) 01/23/2015  . Ankle contusion 08/24/2014  . Adjustment disorder with  depressed mood 08/24/2014  . Polysubstance (excluding opioids) dependence without physiological dependence ([place]) 09/22/2013  . 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 Suicide threat or attempt 09/22/2013  . PTSD (post-traumatic stress disorder) 09/22/2013  . Suicidal ideation 09/22/2013  . Polysubstance dependence ([place]) 09/17/2013    History reviewed. No pertinent surgical history.     History reviewed. No pertinent family history.  Social History   Tobacco Use  . Smoking status: Former Smoker    Packs/day: 0.50    Years: 11.00    Pack years: 5.50    Types: Cigarettes  . Smokeless tobacco: Never Used  Substance Use Topics  . Alcohol use: Yes    Alcohol/week: 24.0 standard drinks    Types: 24 Cans of beer per week    Comment: case daily  . Drug use: Yes    Types: LSD, Marijuana    Comment: reports quitting alcohol and drugs 6 months ago    Home Medications Prior to Admission medications   Medication Sig Start Date End Date Taking? Authorizing Provider  gabapentin (NEURONTIN) 400 MG capsule Take 1 capsule (400 mg total) by mouth 3 (three) times daily. 05/13/19   Johnn Hai, MD  Lumateperone Tosylate (CAPLYTA) 42 MG CAPS Take 1 capsule by mouth daily at 12 noon. 05/13/19   Johnn Hai, MD  polyethylene glycol (MIRALAX / Floria Raveling) packet 17 g mixed in 12 ounces of water.  Take hourly until you have a bowel movement. Patient not taking: Reported on 05/11/2019 10/15/17   Julianne Rice, MD    Allergies  Trazodone and nefazodone  Review of Systems   Review of Systems  Constitutional: Positive for fever.  HENT: Negative for sore throat.   Eyes: Negative for redness.  Respiratory: Positive for cough. Negative for shortness of breath.   Cardiovascular: Positive for chest pain. Negative for leg swelling.  Gastrointestinal: Negative for abdominal pain, diarrhea and vomiting.  Genitourinary: Negative for dysuria and flank pain.   Musculoskeletal: Positive for myalgias. Negative for neck pain and neck stiffness.  Skin: Negative for rash.  Neurological: Negative for headaches.  Hematological: Does not bruise/bleed easily.  Psychiatric/Behavioral: Negative for confusion.    Physical Exam Updated Vital Signs BP 123/90 (BP Location: Left Arm)   Pulse 80   Temp 99.8 F (37.7 C) (Oral)   Resp 18   Ht 1.753 m (5\' 9" )   Wt 70.3 kg   SpO2 100%   BMI 22.89 kg/m   Physical Exam Vitals and nursing note reviewed.  Constitutional:      Appearance: Normal appearance. He is well-developed.  HENT:     Head: Atraumatic.     Nose: Nose normal.     Mouth/Throat:     Mouth: Mucous membranes are moist.     Pharynx: Oropharynx is clear.  Eyes:     General: No scleral icterus.    Conjunctiva/sclera: Conjunctivae normal.  Neck:     Trachea: No tracheal deviation.  Cardiovascular:     Rate and Rhythm: Normal rate and regular rhythm.     Pulses: Normal pulses.     Heart sounds: Normal heart sounds. No murmur. No friction rub. No gallop.   Pulmonary:     Effort: Pulmonary effort is normal. No accessory muscle usage or respiratory distress.     Breath sounds: Normal breath sounds.     Comments: Normal chest wall movement/excursion. Anterior chest wall tenderness, reproducing symptoms, no crepitus.  Chest:     Chest wall: Tenderness present.  Abdominal:     General: Bowel sounds are normal. There is no distension.     Palpations: Abdomen is soft.     Tenderness: There is no abdominal tenderness. There is no guarding.  Genitourinary:    Comments: No cva tenderness. Musculoskeletal:        General: No swelling or tenderness.     Cervical back: Normal range of motion and neck supple. No rigidity.     Right lower leg: No edema.     Left lower leg: No edema.  Skin:    General: Skin is warm and dry.     Findings: No rash.  Neurological:     Mental Status: He is alert.     Comments: Alert, speech clear.    Psychiatric:        Mood and Affect: Mood normal.     ED Results / Procedures / Treatments   Labs (all labs ordered are listed, but only abnormal results are displayed) Results for orders placed or performed during the hospital encounter of 03/20/20  Basic metabolic panel  Result Value Ref Range   Sodium 140 135 - 145 mmol/L   Potassium 4.1 3.5 - 5.1 mmol/L   Chloride 102 98 - 111 mmol/L   CO2 27 22 - 32 mmol/L   Glucose, Bld 95 70 - 99 mg/dL   BUN 8 6 - 20 mg/dL   Creatinine, Ser 05/20/20 0.61 - 1.24 mg/dL   Calcium 9.2 8.9 - 6.57 mg/dL   GFR calc non Af Amer >60 >60 mL/min   GFR calc Af  Amer >60 >60 mL/min   Anion gap 11 5 - 15  CBC  Result Value Ref Range   WBC 3.1 (L) 4.0 - 10.5 K/uL   RBC 5.33 4.22 - 5.81 MIL/uL   Hemoglobin 16.2 13.0 - 17.0 g/dL   HCT 24.2 68.3 - 41.9 %   MCV 88.7 80.0 - 100.0 fL   MCH 30.4 26.0 - 34.0 pg   MCHC 34.2 30.0 - 36.0 g/dL   RDW 62.2 29.7 - 98.9 %   Platelets 185 150 - 400 K/uL   nRBC 0.0 0.0 - 0.2 %  Troponin I (High Sensitivity)  Result Value Ref Range   Troponin I (High Sensitivity) 5 <18 ng/L    EKG EKG Interpretation  Date/Time:  Saturday Mar 20 2020 16:37:28 EDT Ventricular Rate:  90 PR Interval:  142 QRS Duration: 76 QT Interval:  342 QTC Calculation: 418 R Axis:   59 Text Interpretation: Normal sinus rhythm Confirmed by Cathren Laine ([id number]) on 03/20/2020 6:33:59 PM   Radiology No results found.  Procedures Procedures (including critical care time)  Medications Ordered in ED Medications  sodium chloride flush (NS) 0.9 % injection 3 mL (has no administration in time range)  famotidine (PEPCID) tablet 20 mg (has no administration in time range)  alum & mag hydroxide-simeth (MAALOX/MYLANTA) 200-200-20 MG/5ML suspension 30 mL (has no administration in time range)  ibuprofen (ADVIL) tablet 600 mg (has no administration in time range)    ED Course  I have reviewed the triage vital signs and the nursing notes.   Pertinent labs & imaging results that were available during my care of the patient were reviewed by me and considered in my medical decision making (see chart for details).    MDM Rules/Calculators/A&P                      Labs sent. Cxr.   Ibuprofen po.   Reviewed nursing notes and prior charts for additional history.  No prior evals here for same. Recent covid test is positive.   Initial labs reviewed/interpreted by me - trop normal - symptoms/labs not felt c/w acs.   Pts symptoms/exam felt most c/w musculoskeletal/chest wall pain.   cxr reviewed/interpreted by me - no pna.   Pt requests d/c to home. Given clear, equal bs bil, pulse ox 99-100% room air, no increased wob - will d/ to home per pts request.  [name] was evaluated in Emergency Department on 03/20/2020 for the symptoms described in the history of present illness. He was evaluated in the context of the global COVID-19 pandemic, which necessitated consideration that the patient might be at risk for infection with the SARS-CoV-2 virus that causes COVID-19. Institutional protocols and algorithms that pertain to the evaluation of patients at risk for COVID-19 are in a state of rapid change based on information released by regulatory bodies including the CDC and federal and state organizations. These policies and algorithms were followed during the patient's care in the ED.  Pt appears stable for d/c per his request.   Return precautions provided.      Final Clinical Impression(s) / ED Diagnoses Final diagnoses:  None    Rx / DC Orders ED Discharge Orders    None             Cathren Laine, MD 03/20/20 2020
